# Patient Record
Sex: Male | Born: 1960
Health system: Southern US, Community
[De-identification: ages and names within clinical notes are randomized; demographics above are authoritative.]

## PROBLEM LIST (undated history)

## (undated) DIAGNOSIS — Z8719 Personal history of other diseases of the digestive system: Secondary | ICD-10-CM

## (undated) DIAGNOSIS — I10 Essential (primary) hypertension: Secondary | ICD-10-CM

## (undated) DIAGNOSIS — G4700 Insomnia, unspecified: Secondary | ICD-10-CM

## (undated) DIAGNOSIS — K219 Gastro-esophageal reflux disease without esophagitis: Secondary | ICD-10-CM

## (undated) DIAGNOSIS — M1990 Unspecified osteoarthritis, unspecified site: Secondary | ICD-10-CM

## (undated) HISTORY — PX: KNEE FUSION: SUR623

## (undated) HISTORY — DX: Unspecified osteoarthritis, unspecified site: M19.90

## (undated) HISTORY — DX: Essential (primary) hypertension: I10

## (undated) HISTORY — PX: CHOLECYSTECTOMY: SHX55

## (undated) HISTORY — PX: KNEE ARTHROSCOPY: SUR90

---

## 2003-03-19 ENCOUNTER — Emergency Department (HOSPITAL_COMMUNITY): Admission: EM | Admit: 2003-03-19 | Discharge: 2003-03-19 | Payer: Self-pay | Admitting: Emergency Medicine

## 2003-03-19 ENCOUNTER — Encounter: Payer: Self-pay | Admitting: Emergency Medicine

## 2009-09-12 ENCOUNTER — Emergency Department (HOSPITAL_COMMUNITY): Admission: EM | Admit: 2009-09-12 | Discharge: 2009-09-12 | Payer: Self-pay | Admitting: Emergency Medicine

## 2010-12-15 LAB — CBC
HCT: 47.9 % (ref 39.0–52.0)
Hemoglobin: 16.7 g/dL (ref 13.0–17.0)
MCHC: 34.8 g/dL (ref 30.0–36.0)
RBC: 5.01 MIL/uL (ref 4.22–5.81)
RDW: 13.5 % (ref 11.5–15.5)

## 2010-12-15 LAB — DIFFERENTIAL
Basophils Absolute: 0.1 10*3/uL (ref 0.0–0.1)
Lymphocytes Relative: 21 % (ref 12–46)
Lymphs Abs: 2.1 10*3/uL (ref 0.7–4.0)
Monocytes Absolute: 0.6 10*3/uL (ref 0.1–1.0)
Monocytes Relative: 6 % (ref 3–12)
Neutro Abs: 6.9 10*3/uL (ref 1.7–7.7)

## 2010-12-15 LAB — URINALYSIS, ROUTINE W REFLEX MICROSCOPIC
Nitrite: NEGATIVE
Protein, ur: NEGATIVE mg/dL
Urobilinogen, UA: 0.2 mg/dL (ref 0.0–1.0)
pH: 7 (ref 5.0–8.0)

## 2010-12-15 LAB — POCT CARDIAC MARKERS
CKMB, poc: 1.1 ng/mL (ref 1.0–8.0)
Troponin i, poc: <0.05 ng/mL (ref 0.00–0.09)

## 2010-12-15 LAB — COMPREHENSIVE METABOLIC PANEL
Albumin: 3.7 g/dL (ref 3.5–5.2)
Alkaline Phosphatase: 47 U/L (ref 39–117)
BUN: 11 mg/dL (ref 6–23)
Potassium: 3.9 mEq/L (ref 3.5–5.1)
Sodium: 139 mEq/L (ref 135–145)
Total Protein: 6 g/dL (ref 6.0–8.3)

## 2012-05-25 ENCOUNTER — Ambulatory Visit: Payer: Self-pay | Admitting: Family Medicine

## 2012-05-25 ENCOUNTER — Encounter: Payer: Self-pay | Admitting: Family Medicine

## 2012-05-25 VITALS — BP 166/120 | HR 85 | Temp 98.1°F | Resp 18 | Ht 72.0 in | Wt 189.0 lb

## 2012-05-25 DIAGNOSIS — K4090 Unilateral inguinal hernia, without obstruction or gangrene, not specified as recurrent: Secondary | ICD-10-CM

## 2012-05-25 DIAGNOSIS — R030 Elevated blood-pressure reading, without diagnosis of hypertension: Secondary | ICD-10-CM

## 2012-05-25 MED ORDER — HYDROCODONE-ACETAMINOPHEN 5-325 MG PO TABS: 1.0000 | ORAL_TABLET | Freq: Four times a day (QID) | ORAL | Status: AC | PRN

## 2012-05-25 NOTE — Progress Notes (Signed)
Subjective:    Patient ID: Todd Jones, male    DOB: 08-Jul-1961, 51 y.o.   MRN: [id number]  HPI Todd Jones is a 51 y.o. male  Thinks hernia popped up in groin? NKI.   Noticed about 3 weeks ago - tingling sensation, noticed bulge popping out last week - able to push back in.  Been more sore past week - more burning at end of day. Not pushing out as much as before - worse at times. No bowel changes, no blood in stool.  No change in urination.  No fever, no n/v.  Did take few of wife's percocet that helped.  Multiple family members with hernias - dad and brothers.  No personal prior hx of hernias.  Works part time at [employer]. Has shows next 3 days.  Prior bp meds- multiple different meds some with reactions - unknown which ones.    Review of Systems  Constitutional: Negative for fever and chills.  Eyes: Negative for visual disturbance.  Respiratory: Negative for chest tightness and shortness of breath.   Cardiovascular: Negative for chest pain.  Neurological: Negative for dizziness, speech difficulty, weakness and headaches.   As above.      Objective:   Physical Exam  Constitutional: He is oriented to person, place, and time. He appears well-developed and well-nourished.  HENT:  Head: Normocephalic and atraumatic.  Pulmonary/Chest: Effort normal.  Abdominal: Soft. A hernia is present. Hernia confirmed positive in the right inguinal area.    Neurological: He is alert and oriented to person, place, and time.  Skin: Skin is warm and dry. No rash noted. No erythema.  Psychiatric: He has a normal mood and affect. His behavior is normal.       Assessment & Plan:  Todd Jones is a 51 y.o. male 1. Right inguinal hernia  Ambulatory referral to General Surgery, HYDROcodone-acetaminophen (NORCO/VICODIN) 5-325 MG per tablet  2. Elevated BP       Elevated BP - possible pain component, hx of HTN with reactions to certain meds - unknown which ones -  advised to clarify most recent med that he did tolerate. Can follow up here previous practice  to restart med, but will likely need blood work.  Advised of risks of nontreatment of HTN, including CVA, MI, and kidney disease. He will check BP's outside of office and if remains elevated above 140/90 - needs to follow up here or previous provider.   R inguinal hernia.  No signs of strangulation at present, but discussed concerns with increasing pain and need or surgical eval.  Would like something for pain, and may not be able to see surgeon at this point, but agreed to referral to atleast discuss options. warning signs and when to go to emergency room discussed, with s/sx of hernia strangulation, including but not limited to increasing pain and inability to reduce. Advised against ANY straining or lifting, and to use stool softener such as colace to lessen change of straining with BM's. Can try supportive hernia strap at [facility] supply. Lortab Q h prn - #20, only if needed for pain - sed/precautions discussed, understanding of above expressed.    Patient Instructions  Avoid any straining or lifting until evaluated by surgeon.  You can take the hydrocodone up to every 6 hours if needed for pain, but do not drive while taking this medicine.  Drink plenty of water and take a stool softener such as colace to avoid straining with bowel  movements. We will refer you to a surgeon to discuss options, and you can pick up a strap to help with supporting this area at [facility].  If any increase in swelling, redness of skin, increase in pain, change in bowel function, vomiting, or inability to reduce hernia - go to the emergency room as these can be signs of strangulation of intestine in the hernia and would need to be evaluated right away.  Return to the clinic or go to the nearest emergency room if any of your symptoms worsen or new symptoms occur. Keep a record of your blood pressures outside of  the office and bring them to the next office visit- if remains above 140/90 - follow up here or your primary care provider.   Hernia A hernia occurs when an internal organ pushes out through a weak spot in the abdominal wall. Hernias most commonly occur in the groin and around the navel. Hernias often can be pushed back into place (reduced). Most hernias tend to get worse over time. Some abdominal hernias can get stuck in the opening (irreducible or incarcerated hernia) and cannot be reduced. An irreducible abdominal hernia which is tightly squeezed into the opening is at risk for impaired blood supply (strangulated hernia). A strangulated hernia is a medical emergency. Because of the risk for an irreducible or strangulated hernia, surgery may be recommended to repair a hernia. CAUSES   Heavy lifting.   Prolonged coughing.   Straining to have a bowel movement.   A cut (incision) made during an abdominal surgery.  HOME CARE INSTRUCTIONS   Bed rest is not required. You may continue your normal activities.   Avoid lifting more than 10 pounds (4.5 kg) or straining.   Cough gently. If you are a smoker it is best to stop. Even the best hernia repair can break down with the continual strain of coughing. Even if you do not have your hernia repaired, a cough will continue to aggravate the problem.   Do not wear anything tight over your hernia. Do not try to keep it in with an outside bandage or truss. These can damage abdominal contents if they are trapped within the hernia sac.   Eat a normal diet.   Avoid constipation. Straining over long periods of time will increase hernia size and encourage breakdown of repairs. If you cannot do this with diet alone, stool softeners may be used.  SEEK IMMEDIATE MEDICAL CARE IF:   You have a fever.   You develop increasing abdominal pain.   You feel nauseous or vomit.   Your hernia is stuck outside the abdomen, looks discolored, feels hard, or is tender.    You have any changes in your bowel habits or in the hernia that are unusual for you.   You have increased pain or swelling around the hernia.   You cannot push the hernia back in place by applying gentle pressure while lying down.  MAKE SURE YOU:   Understand these instructions.   Will watch your condition.   Will get help right away if you are not doing well or get worse.  Document Released: 08/31/2005 Document Revised: 08/20/2011 Document Reviewed: 04/19/2008 [facility] Patient Information 2012 [place], [place].

## 2012-05-25 NOTE — Patient Instructions (Addendum)
Avoid any straining or lifting until evaluated by surgeon.  You can take the hydrocodone up to every 6 hours if needed for pain, but do not drive while taking this medicine.  Drink plenty of water and take a stool softener such as colace to avoid straining with bowel movements. We will refer you to a surgeon to discuss options, and you can pick up a strap to help with supporting this area at [facility].  If any increase in swelling, redness of skin, increase in pain, change in bowel function, vomiting, or inability to reduce hernia - go to the emergency room as these can be signs of strangulation of intestine in the hernia and would need to be evaluated right away.  Return to the clinic or go to the nearest emergency room if any of your symptoms worsen or new symptoms occur. Keep a record of your blood pressures outside of the office and bring them to the next office visit- if remains above 140/90 - follow up here or your primary care provider.   Hernia A hernia occurs when an internal organ pushes out through a weak spot in the abdominal wall. Hernias most commonly occur in the groin and around the navel. Hernias often can be pushed back into place (reduced). Most hernias tend to get worse over time. Some abdominal hernias can get stuck in the opening (irreducible or incarcerated hernia) and cannot be reduced. An irreducible abdominal hernia which is tightly squeezed into the opening is at risk for impaired blood supply (strangulated hernia). A strangulated hernia is a medical emergency. Because of the risk for an irreducible or strangulated hernia, surgery may be recommended to repair a hernia. CAUSES   Heavy lifting.   Prolonged coughing.   Straining to have a bowel movement.   A cut (incision) made during an abdominal surgery.  HOME CARE INSTRUCTIONS   Bed rest is not required. You may continue your normal activities.   Avoid lifting more than 10 pounds (4.5 kg) or straining.    Cough gently. If you are a smoker it is best to stop. Even the best hernia repair can break down with the continual strain of coughing. Even if you do not have your hernia repaired, a cough will continue to aggravate the problem.   Do not wear anything tight over your hernia. Do not try to keep it in with an outside bandage or truss. These can damage abdominal contents if they are trapped within the hernia sac.   Eat a normal diet.   Avoid constipation. Straining over long periods of time will increase hernia size and encourage breakdown of repairs. If you cannot do this with diet alone, stool softeners may be used.  SEEK IMMEDIATE MEDICAL CARE IF:   You have a fever.   You develop increasing abdominal pain.   You feel nauseous or vomit.   Your hernia is stuck outside the abdomen, looks discolored, feels hard, or is tender.   You have any changes in your bowel habits or in the hernia that are unusual for you.   You have increased pain or swelling around the hernia.   You cannot push the hernia back in place by applying gentle pressure while lying down.  MAKE SURE YOU:   Understand these instructions.   Will watch your condition.   Will get help right away if you are not doing well or get worse.  Document Released: 08/31/2005 Document Revised: 08/20/2011 Document Reviewed: 04/19/2008 [employer] Patient Information 2012  [employer].

## 2012-06-13 ENCOUNTER — Encounter (INDEPENDENT_AMBULATORY_CARE_PROVIDER_SITE_OTHER): Payer: Self-pay | Admitting: General Surgery

## 2012-06-13 ENCOUNTER — Ambulatory Visit (INDEPENDENT_AMBULATORY_CARE_PROVIDER_SITE_OTHER): Payer: Self-pay | Admitting: General Surgery

## 2012-06-13 VITALS — BP 192/112 | HR 84 | Temp 97.3°F | Resp 16 | Ht 73.75 in | Wt 191.4 lb

## 2012-06-13 DIAGNOSIS — K4090 Unilateral inguinal hernia, without obstruction or gangrene, not specified as recurrent: Secondary | ICD-10-CM

## 2012-06-13 NOTE — Progress Notes (Signed)
Subjective:     Patient ID: Todd Jones, male   DOB: [birth year], 51 y.o.   MRN: [id number]  HPI The patient is 51 year old male with a several month history of right inguinal hernia. He states that the area has become more painful as an increase in size in the last several weeks. Patient is wearing a truss which does help with pain and burning sensation. The patient otherwise has no signs of incarceration or strangulation at this time.  The patient has no primary care is not undergoing treatment for his hypertension which patient he has a long-standing history.  Review of Systems  Constitutional: Negative.   HENT: Negative.   Eyes: Negative.   Cardiovascular: Negative.   Gastrointestinal: Negative.   Musculoskeletal: Negative.   Neurological: Negative.  Negative for seizures, weakness and headaches.       Objective:   Physical Exam  Constitutional: He is oriented to person, place, and time. He appears well-developed and well-nourished.  HENT:  Head: Normocephalic and atraumatic.  Eyes: Conjunctivae normal are normal. Pupils are equal, round, and reactive to light.  Neck: Normal range of motion. Neck supple.  Cardiovascular: Normal rate, regular rhythm and normal heart sounds.   Pulmonary/Chest: Effort normal and breath sounds normal.  Abdominal: Soft. Bowel sounds are normal. A hernia is present. Hernia confirmed positive in the right inguinal area (reducible).  Musculoskeletal: Normal range of motion.  Neurological: He is oriented to person, place, and time.       Assessment:     Patient is a 51 year old male with a right reducible inguinal hernia, a long-standing history of hypertension that is currently under treatment.    Plan:     1. I discussed the treatments of right inguinal hernia repair with the patient.  However in light of the patient's untreated hypertension recommend patient PCP at this time his blood pressure control prior to scheduling undergoing hernia  surgery. The patient understands the risk this point we discussed the signs and symptoms of inguinal hernia incarceration and report to the ED patient is having signs or symptoms.  The patient will followup after being seen and treated for his hypertension.

## 2012-07-06 ENCOUNTER — Other Ambulatory Visit (INDEPENDENT_AMBULATORY_CARE_PROVIDER_SITE_OTHER): Payer: Self-pay | Admitting: General Surgery

## 2012-07-06 ENCOUNTER — Telehealth (INDEPENDENT_AMBULATORY_CARE_PROVIDER_SITE_OTHER): Payer: Self-pay

## 2012-07-06 ENCOUNTER — Telehealth (INDEPENDENT_AMBULATORY_CARE_PROVIDER_SITE_OTHER): Payer: Self-pay | Admitting: General Surgery

## 2012-07-06 MED ORDER — OXYCODONE-ACETAMINOPHEN 5-325 MG PO TABS: 1.0000 | ORAL_TABLET | ORAL | Status: DC | PRN

## 2012-07-06 NOTE — Telephone Encounter (Signed)
Pt saw Dr. Derrell Lolling this past September for a hernia.  Due to lack of insurance he is unable to schedule his surgery until mid-December.  He has requested a refill of pain medication.

## 2012-07-06 NOTE — Telephone Encounter (Signed)
Called pt and we have a written Rx for percocet at the front desk

## 2012-07-06 NOTE — Telephone Encounter (Signed)
I've printed out another Rx for percocet that he can come and pick up.

## 2012-08-04 ENCOUNTER — Telehealth (INDEPENDENT_AMBULATORY_CARE_PROVIDER_SITE_OTHER): Payer: Self-pay | Admitting: General Surgery

## 2012-08-04 NOTE — Telephone Encounter (Signed)
Pt called to request additional pain meds.  He will have insurance by August 28, 2012, so will be ready to schedule his hernia surgery.  Last time he received meds was 07/06/12, for # 20.  Please advise via cell phone:  [phone]

## 2012-09-09 ENCOUNTER — Telehealth (INDEPENDENT_AMBULATORY_CARE_PROVIDER_SITE_OTHER): Payer: Self-pay

## 2012-09-09 NOTE — Telephone Encounter (Signed)
Pt has scheduled appt to see Dr Derrell Lolling in January to discuss having his hernia repair done. Pt request refill on his pain medication. Pt uses [facility]. Pt advised I will send request to Dr Derrell Lolling.

## 2012-09-09 NOTE — Telephone Encounter (Signed)
Dr. Derrell Lolling wrote Rx for Percocet 5/325 mg, # 30, 1 po Q6H prn pain, no refill.  Pt contacted to pick up Rx at the front desk.

## 2012-09-27 ENCOUNTER — Telehealth (INDEPENDENT_AMBULATORY_CARE_PROVIDER_SITE_OTHER): Payer: Self-pay | Admitting: General Surgery

## 2012-09-27 NOTE — Telephone Encounter (Signed)
Called patient to have him come in sooner for his appt on 1/15.Marland Kitchenpatient was very happy to do so

## 2012-09-28 ENCOUNTER — Other Ambulatory Visit (INDEPENDENT_AMBULATORY_CARE_PROVIDER_SITE_OTHER): Payer: Self-pay | Admitting: General Surgery

## 2012-09-28 ENCOUNTER — Ambulatory Visit (INDEPENDENT_AMBULATORY_CARE_PROVIDER_SITE_OTHER): Payer: BC Managed Care – PPO | Admitting: General Surgery

## 2012-09-28 ENCOUNTER — Encounter (INDEPENDENT_AMBULATORY_CARE_PROVIDER_SITE_OTHER): Payer: Self-pay | Admitting: General Surgery

## 2012-09-28 VITALS — BP 132/84 | HR 64 | Temp 97.5°F | Resp 18 | Ht 73.0 in | Wt 200.6 lb

## 2012-09-28 DIAGNOSIS — K4090 Unilateral inguinal hernia, without obstruction or gangrene, not specified as recurrent: Secondary | ICD-10-CM

## 2012-09-28 NOTE — Progress Notes (Signed)
Patient ID: Todd Jones, male   DOB: 01/22/1961, 51 y.o.   MRN: [id number]  Chief Complaint  Patient presents with  . Pre-op Exam    eval RIH    HPI Todd Jones is a 51 y.o. male.  The patient is a 51-year-old male who was seen previously for a right inguinal hernia. Patient returned with increased right inguinal hernia, increased pain, and better blood pressure control. Patient has been wearing a truss which she states helps at times pain of the hernia. Patient is continued exercise and his blood pressure is 132/84. HPI  Past Medical History  Diagnosis Date  . Arthritis   . Hypertension     Past Surgical History  Procedure Date  . Cholecystectomy   . Knee fusion     Family History  Problem Relation Age of Onset  . Cancer Maternal Grandfather     lung  . Alcohol abuse Maternal Grandfather     Social History History  Substance Use Topics  . Smoking status: Former Smoker  . Smokeless tobacco: Never Used  . Alcohol Use: No    No Known Allergies  Current Outpatient Prescriptions  Medication Sig Dispense Refill  . oxyCODONE-acetaminophen (ROXICET) 5-325 MG per tablet Take 1 tablet by mouth every 4 (four) hours as needed for pain.  20 tablet  0    Review of Systems Review of Systems  Constitutional: Negative.   HENT: Negative.   Eyes: Negative.   Respiratory: Negative.   Cardiovascular: Negative.   Gastrointestinal: Negative.   Neurological: Negative.     Blood pressure 132/84, pulse 64, temperature 97.5 F (36.4 C), temperature source Temporal, resp. rate 18, height 6' 1" (1.854 m), weight 200 lb 9.6 oz (90.992 kg).  Physical Exam Physical Exam  Constitutional: He is oriented to person, place, and time. He appears well-developed and well-nourished.  HENT:  Head: Normocephalic and atraumatic.  Eyes: Conjunctivae normal are normal. Pupils are equal, round, and reactive to light.  Neck: Normal range of motion. Neck supple.  Cardiovascular: Normal rate,  regular rhythm and normal heart sounds.   Pulmonary/Chest: Effort normal and breath sounds normal.  Abdominal: Soft. Bowel sounds are normal. A hernia is present. Hernia confirmed positive in the right inguinal area. Hernia confirmed negative in the left inguinal area.  Musculoskeletal: Normal range of motion.  Neurological: He is alert and oriented to person, place, and time.    Data Reviewed none  Assessment    51-year-old male with a right inguinal hernia likely direct and indirect    Plan    1. We'll proceed operating room for laparoscopic right anal hernia repair with mesh.  2. All risks and benefits were discussed with the patient, to generally include infection, bleeding, damage to surrounding structures, and recurrence. Alternatives were offered and described.  All questions were answered and the patient voiced understanding of the procedure and wishes to proceed at this point.         Tacara Hadlock Jr., [facility] Riches 09/28/2012, 2:11 PM    

## 2012-10-03 NOTE — Progress Notes (Signed)
Need orders in [facility] for surgery on 10/14/12.  Preop will be on 10/07/12 at 1100am.  Thanks.

## 2012-10-04 ENCOUNTER — Other Ambulatory Visit (INDEPENDENT_AMBULATORY_CARE_PROVIDER_SITE_OTHER): Payer: Self-pay | Admitting: General Surgery

## 2012-10-04 ENCOUNTER — Encounter (HOSPITAL_COMMUNITY): Payer: Self-pay | Admitting: Pharmacy Technician

## 2012-10-06 ENCOUNTER — Other Ambulatory Visit (HOSPITAL_COMMUNITY): Payer: Self-pay | Admitting: General Surgery

## 2012-10-06 NOTE — Patient Instructions (Addendum)
20 Todd Jones  10/06/2012   Your procedure is scheduled on:  10/14/12 FRIDAY  Report to [facility] at  0530     AM.  Call this number if you have problems the morning of surgery: [phone]       Remember:   Do not eat food  Or drink :After Midnight. Thursday NIGHT   Take these medicines the morning of surgery with A SIP OF WATER:  precocet if needed   .  Contacts, dentures or partial plates can not be worn to surgery  Leave suitcase in the car. After surgery it may be brought to your room.  For patients admitted to the hospital, checkout time is 11:00 AM day of  discharge.             SPECIAL INSTRUCTIONS- SEE [facility] PREPARING FOR SURGERY INSTRUCTION SHEET-     DO NOT WEAR JEWELRY, LOTIONS, POWDERS, OR PERFUMES.  WOMEN-- DO NOT SHAVE LEGS OR UNDERARMS FOR 12 HOURS BEFORE SHOWERS. MEN MAY SHAVE FACE.  Patients discharged the day of surgery will not be allowed to drive home. IF going home the day of surgery, you must have a driver and someone to stay with you for the first 24 hours  Name and phone number of your driver:  Wife  [name]  [phone]                                                                      Please read over the following fact sheets that you were given: MRSA Information, Incentive Spirometry Sheet, Blood Transfusion Sheet  Information                                                                                   [name]  PST 336  [phone]                 FAILURE TO FOLLOW THESE INSTRUCTIONS MAY RESULT IN  CANCELLATION   OF YOUR SURGERY                                                  Patient Signature _____________________________

## 2012-10-07 ENCOUNTER — Encounter (HOSPITAL_COMMUNITY)
Admission: RE | Admit: 2012-10-07 | Discharge: 2012-10-07 | Disposition: A | Discharge status: Home / Self Care | Payer: BC Managed Care – PPO | Source: Ambulatory Visit | Attending: General Surgery | Admitting: General Surgery

## 2012-10-07 ENCOUNTER — Ambulatory Visit (HOSPITAL_COMMUNITY)
Admission: RE | Admit: 2012-10-07 | Discharge: 2012-10-07 | Disposition: A | Discharge status: Home / Self Care | Payer: BC Managed Care – PPO | Source: Ambulatory Visit | Attending: General Surgery | Admitting: General Surgery

## 2012-10-07 ENCOUNTER — Encounter (HOSPITAL_COMMUNITY): Payer: Self-pay

## 2012-10-07 ENCOUNTER — Telehealth (INDEPENDENT_AMBULATORY_CARE_PROVIDER_SITE_OTHER): Payer: Self-pay | Admitting: General Surgery

## 2012-10-07 DIAGNOSIS — K4090 Unilateral inguinal hernia, without obstruction or gangrene, not specified as recurrent: Secondary | ICD-10-CM | POA: Insufficient documentation

## 2012-10-07 DIAGNOSIS — Z01812 Encounter for preprocedural laboratory examination: Secondary | ICD-10-CM | POA: Insufficient documentation

## 2012-10-07 HISTORY — DX: Insomnia, unspecified: G47.00

## 2012-10-07 HISTORY — DX: Personal history of other diseases of the digestive system: Z87.19

## 2012-10-07 HISTORY — DX: Gastro-esophageal reflux disease without esophagitis: K21.9

## 2012-10-07 LAB — BASIC METABOLIC PANEL
BUN: 11 mg/dL (ref 6–23)
GFR calc non Af Amer: >90 mL/min (ref >90)
Glucose, Bld: 98 mg/dL (ref 70–99)
Potassium: 4.7 mEq/L (ref 3.5–5.1)

## 2012-10-07 LAB — CBC
HCT: 46.9 % (ref 39.0–52.0)
Hemoglobin: 15.7 g/dL (ref 13.0–17.0)
MCH: 32 pg (ref 26.0–34.0)
MCHC: 33.5 g/dL (ref 30.0–36.0)
MCV: 95.5 fL (ref 78.0–100.0)

## 2012-10-07 LAB — SURGICAL PCR SCREEN: MRSA, PCR: NEGATIVE

## 2012-10-07 NOTE — Progress Notes (Signed)
10/07/12 1123  OBSTRUCTIVE SLEEP APNEA  Have you ever been diagnosed with sleep apnea through a sleep study? No  Do you snore loudly (loud enough to be heard through closed doors)?  0  Do you often feel tired, fatigued, or sleepy during the daytime? 1  Has anyone observed you stop breathing during your sleep? 0  Do you have, or are you being treated for high blood pressure? 1  BMI more than 35 kg/m2? 0  Age over 54 years old? 1  Neck circumference greater than 40 cm/18 inches? 0  Gender: 1  Obstructive Sleep Apnea Score 4   Score 4 or greater  Results sent to PCP (surgeon)

## 2012-10-07 NOTE — Telephone Encounter (Signed)
Called patient to let him know that protocol Norco 5/325 #30 no refills was called into [facility]..patient is ok with this

## 2012-10-07 NOTE — Progress Notes (Signed)
Dr Derrell Lolling-   I saw Todd Jones in PST today-  Has had HTN in the past- on no meds and doesnt have  PCP as hasnt had any insurance.  BP 153/105, repeated x 2 with 189/102 as the lowest diastolic number. I instructed him he needs to have this evaluated before surgery and informed him of risk factors with BP this elevated and that if remains this high day of surgery anesthesia may cancel him. Stated would follow up.  Also STOP BANG SCORE 4- FYI.  Thanks

## 2012-10-10 ENCOUNTER — Ambulatory Visit (INDEPENDENT_AMBULATORY_CARE_PROVIDER_SITE_OTHER): Payer: BC Managed Care – PPO | Admitting: Family Medicine

## 2012-10-10 VITALS — BP 157/109 | HR 67 | Temp 98.3°F | Resp 16 | Ht 73.0 in | Wt 202.0 lb

## 2012-10-10 DIAGNOSIS — I10 Essential (primary) hypertension: Secondary | ICD-10-CM

## 2012-10-10 MED ORDER — LISINOPRIL 10 MG PO TABS: 10.0000 mg | ORAL_TABLET | Freq: Every day | ORAL | Status: DC

## 2012-10-10 NOTE — Patient Instructions (Addendum)
Start on one lisinopril pill a day.  If your BP is still running higher than 140/ 90 after 3 or 4 days we can increase to 2 pills (20 mg total) a day. Let's plan to recheck after your surgery to see how your BP is doing.  Call if any questions about your BP in the meantime

## 2012-10-10 NOTE — Progress Notes (Signed)
[facility] [address] [place] [address] [phone]  Date:  10/10/2012   Name:  Todd Jones   DOB:  01/27/1961   MRN:  [id number]  PCP:  Provider Not In System    Chief Complaint: Hypertension   History of Present Illness:  Todd Jones is a 52 y.o. very pleasant male patient who presents with the following:  He is here today to discuss his BP.  He has had HTN since his 27's- however he lost weight and had been able to do without medications for some years.   He checks his BP at home, and it has been running about 130- 180/ 98- 102.  He has noted this for about 6 months.  There has been a lot of stress at home as his wife has lung cancer that is not curable.   He is having inguinal hernia repair next week- he needs his BP to be under control for his surgery.  He had a bad reaction to metoprolol in the past, but is not sure what else he took in the past.   He has not been on medication in about 12 years.   Recent normal CMP for pre- op planning    There is no problem list on file for this patient.   Past Medical History  Diagnosis Date  . Arthritis   . Hypertension   . GERD (gastroesophageal reflux disease)   . H/O hiatal hernia   . Insomnia     Past Surgical History  Procedure Date  . Cholecystectomy   . Knee fusion   . Knee arthroscopy     right x 3    History  Substance Use Topics  . Smoking status: Former Games developer  . Smokeless tobacco: Never Used  . Alcohol Use: No    Family History  Problem Relation Age of Onset  . Cancer Maternal Grandfather     lung  . Alcohol abuse Maternal Grandfather     No Known Allergies  Medication list has been reviewed and updated.  Current Outpatient Prescriptions on File Prior to Visit  Medication Sig Dispense Refill  . oxyCODONE-acetaminophen (PERCOCET) 10-325 MG per tablet Take 1 tablet by mouth 3 (three) times daily as needed. Pain        Review of Systems:  As per HPI-  otherwise negative.   Physical Examination: Filed Vitals:   10/10/12 1543  BP: 157/109  Pulse: 67  Temp: 98.3 F (36.8 C)  Resp: 16   Filed Vitals:   10/10/12 1543  Height: 6\' 1"  (1.854 m)  Weight: 202 lb (91.627 kg)   Body mass index is 26.65 kg/(m^2). Ideal Body Weight: Weight in (lb) to have BMI = 25: 189.1   GEN: WDWN, NAD, Non-toxic, A & O x 3 HEENT: Atraumatic, Normocephalic. Neck supple. No masses, No LAD. Ears and Nose: No external deformity. CV: RRR, No M/G/R. No JVD. No thrill. No extra heart sounds. PULM: CTA B, no wheezes, crackles, rhonchi. No retractions. No resp. distress. No accessory muscle use. ABD: S, NT, ND. No rebound. No HSM. EXTR: No c/c/e NEURO Normal gait.  PSYCH: Normally interactive. Conversant. Not depressed or anxious appearing.  Calm demeanor.    Assessment and Plan: 1. HTN (hypertension)  lisinopril (PRINIVIL,ZESTRIL) 10 MG tablet   Start lisinopril at 10 mg, will likely need to go up on his dose.  He will increase if BP still above goal in a few days.  Follow- up  after surgery for recheck of BP and also to discuss his chronic insomnia  Mickel Schreur, MD

## 2012-10-14 ENCOUNTER — Encounter (HOSPITAL_COMMUNITY): Payer: Self-pay | Admitting: Anesthesiology

## 2012-10-14 ENCOUNTER — Ambulatory Visit (HOSPITAL_COMMUNITY)
Admission: RE | Admit: 2012-10-14 | Discharge: 2012-10-14 | Disposition: A | Discharge status: Home / Self Care | Payer: BC Managed Care – PPO | Source: Ambulatory Visit | Attending: General Surgery | Admitting: General Surgery

## 2012-10-14 ENCOUNTER — Ambulatory Visit (HOSPITAL_COMMUNITY): Payer: BC Managed Care – PPO | Admitting: Anesthesiology

## 2012-10-14 ENCOUNTER — Encounter (HOSPITAL_COMMUNITY): Payer: Self-pay | Admitting: *Deleted

## 2012-10-14 ENCOUNTER — Encounter (HOSPITAL_COMMUNITY)
Admission: RE | Disposition: A | Discharge status: Home / Self Care | Payer: Self-pay | Source: Ambulatory Visit | Attending: General Surgery

## 2012-10-14 DIAGNOSIS — K4090 Unilateral inguinal hernia, without obstruction or gangrene, not specified as recurrent: Secondary | ICD-10-CM

## 2012-10-14 DIAGNOSIS — I10 Essential (primary) hypertension: Secondary | ICD-10-CM | POA: Insufficient documentation

## 2012-10-14 HISTORY — PX: INSERTION OF MESH: SHX5868

## 2012-10-14 HISTORY — PX: INGUINAL HERNIA REPAIR: SHX194

## 2012-10-14 SURGERY — REPAIR, HERNIA, INGUINAL, LAPAROSCOPIC
Anesthesia: General | Site: Groin | Laterality: Right | Wound class: Clean

## 2012-10-14 MED ORDER — NEOSTIGMINE METHYLSULFATE 1 MG/ML IJ SOLN
INTRAMUSCULAR | Status: DC | PRN
  Administered 2012-10-14: 4 mg via INTRAVENOUS

## 2012-10-14 MED ORDER — FENTANYL CITRATE 0.05 MG/ML IJ SOLN
25.0000 ug | INTRAMUSCULAR | Status: DC | PRN
  Administered 2012-10-14 (×3): 50 ug via INTRAVENOUS

## 2012-10-14 MED ORDER — KETOROLAC TROMETHAMINE 30 MG/ML IJ SOLN
INTRAMUSCULAR | Status: DC | PRN
  Administered 2012-10-14: 30 mg via INTRAVENOUS

## 2012-10-14 MED ORDER — LIDOCAINE HCL (CARDIAC) 20 MG/ML IV SOLN
INTRAVENOUS | Status: DC | PRN
  Administered 2012-10-14: 100 mg via INTRAVENOUS

## 2012-10-14 MED ORDER — ACETAMINOPHEN 650 MG RE SUPP
650.0000 mg | RECTAL | Status: DC | PRN
  Filled 2012-10-14: qty 1

## 2012-10-14 MED ORDER — ONDANSETRON HCL 4 MG/2ML IJ SOLN: 4.0000 mg | Freq: Four times a day (QID) | INTRAMUSCULAR | Status: DC | PRN

## 2012-10-14 MED ORDER — PROMETHAZINE HCL 25 MG/ML IJ SOLN: 6.2500 mg | INTRAMUSCULAR | Status: DC | PRN

## 2012-10-14 MED ORDER — BUPIVACAINE-EPINEPHRINE 0.25% -1:200000 IJ SOLN
INTRAMUSCULAR | Status: DC | PRN
  Administered 2012-10-14: 20 mL
  Administered 2012-10-14: 15 mL

## 2012-10-14 MED ORDER — BUPIVACAINE-EPINEPHRINE PF 0.25-1:200000 % IJ SOLN
INTRAMUSCULAR | Status: AC
  Filled 2012-10-14: qty 30

## 2012-10-14 MED ORDER — FENTANYL CITRATE 0.05 MG/ML IJ SOLN
INTRAMUSCULAR | Status: AC
  Filled 2012-10-14: qty 2

## 2012-10-14 MED ORDER — ACETAMINOPHEN 10 MG/ML IV SOLN
INTRAVENOUS | Status: DC | PRN
  Administered 2012-10-14: 1000 mg via INTRAVENOUS

## 2012-10-14 MED ORDER — OXYCODONE HCL 5 MG PO TABS: 5.0000 mg | ORAL_TABLET | ORAL | Status: DC | PRN

## 2012-10-14 MED ORDER — DEXAMETHASONE SODIUM PHOSPHATE 10 MG/ML IJ SOLN
INTRAMUSCULAR | Status: DC | PRN
  Administered 2012-10-14: 8 mg via INTRAVENOUS

## 2012-10-14 MED ORDER — SODIUM CHLORIDE 0.9 % IV SOLN: 250.0000 mL | INTRAVENOUS | Status: DC | PRN

## 2012-10-14 MED ORDER — FENTANYL CITRATE 0.05 MG/ML IJ SOLN
INTRAMUSCULAR | Status: DC | PRN
  Administered 2012-10-14 (×2): 50 ug via INTRAVENOUS
  Administered 2012-10-14: 100 ug via INTRAVENOUS
  Administered 2012-10-14 (×2): 50 ug via INTRAVENOUS

## 2012-10-14 MED ORDER — SODIUM CHLORIDE 0.9 % IJ SOLN: 3.0000 mL | INTRAMUSCULAR | Status: DC | PRN

## 2012-10-14 MED ORDER — LACTATED RINGERS IV SOLN
INTRAVENOUS | Status: DC | PRN
  Administered 2012-10-14 (×2): via INTRAVENOUS

## 2012-10-14 MED ORDER — CEFAZOLIN SODIUM-DEXTROSE 2-3 GM-% IV SOLR
INTRAVENOUS | Status: AC
  Filled 2012-10-14: qty 50

## 2012-10-14 MED ORDER — SODIUM CHLORIDE 0.9 % IJ SOLN: 3.0000 mL | Freq: Two times a day (BID) | INTRAMUSCULAR | Status: DC

## 2012-10-14 MED ORDER — GLYCOPYRROLATE 0.2 MG/ML IJ SOLN
INTRAMUSCULAR | Status: DC | PRN
  Administered 2012-10-14: 0.6 mg via INTRAVENOUS

## 2012-10-14 MED ORDER — PROPOFOL 10 MG/ML IV BOLUS
INTRAVENOUS | Status: DC | PRN
  Administered 2012-10-14: 200 mg via INTRAVENOUS

## 2012-10-14 MED ORDER — SUCCINYLCHOLINE CHLORIDE 20 MG/ML IJ SOLN
INTRAMUSCULAR | Status: DC | PRN
  Administered 2012-10-14: 100 mg via INTRAVENOUS

## 2012-10-14 MED ORDER — CEFAZOLIN SODIUM-DEXTROSE 2-3 GM-% IV SOLR
2.0000 g | INTRAVENOUS | Status: AC
  Administered 2012-10-14: 2 g via INTRAVENOUS

## 2012-10-14 MED ORDER — SODIUM CHLORIDE 0.9 % IR SOLN
Status: DC | PRN
  Administered 2012-10-14: 1000 mL

## 2012-10-14 MED ORDER — KETOROLAC TROMETHAMINE 30 MG/ML IJ SOLN: 15.0000 mg | Freq: Once | INTRAMUSCULAR | Status: DC | PRN

## 2012-10-14 MED ORDER — LACTATED RINGERS IV SOLN: INTRAVENOUS | Status: DC

## 2012-10-14 MED ORDER — ONDANSETRON HCL 4 MG/2ML IJ SOLN
INTRAMUSCULAR | Status: DC | PRN
  Administered 2012-10-14: 4 mg via INTRAVENOUS

## 2012-10-14 MED ORDER — ACETAMINOPHEN 10 MG/ML IV SOLN
INTRAVENOUS | Status: AC
  Filled 2012-10-14: qty 100

## 2012-10-14 MED ORDER — OXYCODONE-ACETAMINOPHEN 5-325 MG PO TABS: 1.0000 | ORAL_TABLET | ORAL | Status: DC | PRN

## 2012-10-14 MED ORDER — CISATRACURIUM BESYLATE (PF) 10 MG/5ML IV SOLN
INTRAVENOUS | Status: DC | PRN
  Administered 2012-10-14: 5 mg via INTRAVENOUS

## 2012-10-14 MED ORDER — ACETAMINOPHEN 325 MG PO TABS: 650.0000 mg | ORAL_TABLET | ORAL | Status: DC | PRN

## 2012-10-14 MED ORDER — MIDAZOLAM HCL 5 MG/5ML IJ SOLN
INTRAMUSCULAR | Status: DC | PRN
  Administered 2012-10-14: 2 mg via INTRAVENOUS

## 2012-10-14 MED ORDER — HYDRALAZINE HCL 20 MG/ML IJ SOLN
INTRAMUSCULAR | Status: DC | PRN
  Administered 2012-10-14: 2.5 mg via INTRAVENOUS

## 2012-10-14 SURGICAL SUPPLY — 39 items
APPLIER CLIP 5 13 M/L LIGAMAX5 (MISCELLANEOUS)
BENZOIN TINCTURE PRP APPL 2/3 (GAUZE/BANDAGES/DRESSINGS) ×2 IMPLANT
CLIP APPLIE 5 13 M/L LIGAMAX5 (MISCELLANEOUS) IMPLANT
CLOTH BEACON ORANGE TIMEOUT ST (SAFETY) ×2 IMPLANT
DECANTER SPIKE VIAL GLASS SM (MISCELLANEOUS) ×2 IMPLANT
DERMABOND ADVANCED (GAUZE/BANDAGES/DRESSINGS)
DERMABOND ADVANCED .7 DNX12 (GAUZE/BANDAGES/DRESSINGS) IMPLANT
DEVICE SECURE STRAP 25 ABSORB (INSTRUMENTS) IMPLANT
DISSECT BALLN SPACEMKR OVL PDB (BALLOONS) ×2
DISSECTOR BALLN SPCMKR OVL PDB (BALLOONS) ×1 IMPLANT
DISSECTOR BLUNT TIP ENDO 5MM (MISCELLANEOUS) IMPLANT
DRAPE LAPAROSCOPIC ABDOMINAL (DRAPES) ×2 IMPLANT
DRAPE UTILITY XL STRL (DRAPES) ×2 IMPLANT
ELECT REM PT RETURN 9FT ADLT (ELECTROSURGICAL) ×2
ELECTRODE REM PT RTRN 9FT ADLT (ELECTROSURGICAL) ×1 IMPLANT
GLOVE BIO SURGEON STRL SZ7.5 (GLOVE) ×4 IMPLANT
GLOVE BIOGEL PI IND STRL 7.0 (GLOVE) ×1 IMPLANT
GLOVE BIOGEL PI INDICATOR 7.0 (GLOVE) ×1
GOWN PREVENTION PLUS XLARGE (GOWN DISPOSABLE) ×2 IMPLANT
GOWN STRL NON-REIN LRG LVL3 (GOWN DISPOSABLE) ×2 IMPLANT
GOWN STRL REIN XL XLG (GOWN DISPOSABLE) ×6 IMPLANT
KIT BASIN OR (CUSTOM PROCEDURE TRAY) ×2 IMPLANT
MARKER SKIN DUAL TIP RULER LAB (MISCELLANEOUS) ×2 IMPLANT
MESH PARIETEX 20X20CM (Mesh General) ×2 IMPLANT
NEEDLE INSUFFLATION 14GA 120MM (NEEDLE) ×2 IMPLANT
SET IRRIG TUBING LAPAROSCOPIC (IRRIGATION / IRRIGATOR) IMPLANT
SOLUTION ANTI FOG 6CC (MISCELLANEOUS) ×2 IMPLANT
STAPLER HERNIA 12 8.5 360D (INSTRUMENTS) ×2 IMPLANT
STRIP CLOSURE SKIN 1/2X4 (GAUZE/BANDAGES/DRESSINGS) ×2 IMPLANT
SUT MNCRL AB 4-0 PS2 18 (SUTURE) ×4 IMPLANT
SYRINGE IRR TOOMEY STRL 70CC (SYRINGE) IMPLANT
TACKER 5MM HERNIA 3.5CML NAB (ENDOMECHANICALS) IMPLANT
TOWEL OR 17X26 10 PK STRL BLUE (TOWEL DISPOSABLE) ×2 IMPLANT
TRAY FOLEY CATH 14FRSI W/METER (CATHETERS) ×2 IMPLANT
TRAY LAP CHOLE (CUSTOM PROCEDURE TRAY) ×2 IMPLANT
TROCAR BLADELESS OPT 5 75 (ENDOMECHANICALS) IMPLANT
TROCAR CANNULA W/PORT DUAL 5MM (MISCELLANEOUS) ×4 IMPLANT
TROCAR XCEL 12X100 BLDLESS (ENDOMECHANICALS) IMPLANT
TUBING INSUFFLATION 10FT LAP (TUBING) ×2 IMPLANT

## 2012-10-14 NOTE — Anesthesia Postprocedure Evaluation (Signed)
  Anesthesia Post-op Note  Patient: Todd Jones  Procedure(s) Performed: Procedure(s) (LRB): LAPAROSCOPIC INGUINAL HERNIA (Right) INSERTION OF MESH (Right)  Patient Location: PACU  Anesthesia Type: General  Level of Consciousness: awake and alert   Airway and Oxygen Therapy: Patient Spontanous Breathing  Post-op Pain: mild  Post-op Assessment: Post-op Vital signs reviewed, Patient's Cardiovascular Status Stable, Respiratory Function Stable, Patent Airway and No signs of Nausea or vomiting  Last Vitals:  Filed Vitals:   10/14/12 0900  BP: 177/97  Pulse: 67  Temp:   Resp: 12    Post-op Vital Signs: stable   Complications: No apparent anesthesia complications

## 2012-10-14 NOTE — Transfer of Care (Signed)
Immediate Anesthesia Transfer of Care Note  Patient: Todd Jones  Procedure(s) Performed: Procedure(s) (LRB) with comments: LAPAROSCOPIC INGUINAL HERNIA (Right) INSERTION OF MESH (Right)  Patient Location: PACU  Anesthesia Type:General  Level of Consciousness: awake, alert  and oriented  Airway & Oxygen Therapy: Patient Spontanous Breathing and Patient connected to face mask oxygen  Post-op Assessment: Report given to PACU RN and Post -op Vital signs reviewed and stable  Post vital signs: Reviewed and stable  Complications: No apparent anesthesia complications

## 2012-10-14 NOTE — Anesthesia Procedure Notes (Signed)
Procedure Name: Intubation Date/Time: 10/14/2012 7:14 AM Performed by: Leroy Libman L Patient Re-evaluated:Patient Re-evaluated prior to inductionOxygen Delivery Method: Circle system utilized Preoxygenation: Pre-oxygenation with 100% oxygen Intubation Type: IV induction Ventilation: Mask ventilation without difficulty and Oral airway inserted - appropriate to patient size Laryngoscope Size: Miller and 3 Grade View: Grade I Tube type: Oral Tube size: 8.0 mm Number of attempts: 1 Airway Equipment and Method: Stylet Placement Confirmation: ETT inserted through vocal cords under direct vision,  breath sounds checked- equal and bilateral and positive ETCO2 Secured at: 23 cm Tube secured with: Tape Dental Injury: Teeth and Oropharynx as per pre-operative assessment

## 2012-10-14 NOTE — Op Note (Signed)
Pre Operative Diagnosis:  RIH   Post Operative Diagnosis: R indirect inguinal hernia  Procedure: Lap RIHR with mesh  Surgeon: Dr. Axel Filler  Assistant: none  Anesthesia: GETA  EBL: 5cc  Complications: none  Counts: reported as correct x 2  Findings:  Large R indirect inguinal hernia  Indications for procedure:  The pt is a 52 y/o M with several month h/o RIH pain.  Pt decided secondary to pain to get this repair electively.  Details of the procedure:The patient was taken back to the operating room. The patient was placed in supine position with bilateral SCDs in place. After appropriate anitbiotics were confirmed, a time-out was confirmed and all facts were verified.  0.25% Marcaine was used to infiltrate the umbilical area. He was used to cut down the skin and blunt dissection was used to get the anterior fashion.  The anterior fascia was incised approximately 1 cm and the muscles were divided anteriorly. Blunt dissection was then used to create a space in the preperitoneal area. At this time a 10 mm camera was then introduced into the space and advanced the pubic tubercle and a 12 mm trocar was placed over this and insufflation was started.  At this time and space was created from medial to laterally the preperitoneal space. The hernia sac was identified and was seen in the indirect space. Dissection of the hernia sac was undertaken the vas deferens was identified and protected in all parts of the case.    Once the large hernia sac was taken down to approximately the umbilicus to TET 20x20 Parietex mesh was cut into shape and introduced into the preperitoneal space.  THe hernia sac retracted toward the abd above the level of the umbilicus.  The mesh was brought over the hernia defect and anchored into place and secured to Cooper's ligament with 4.0 there was a hernia stapler staples. It was anchored to the anterior abdominal wall with 4.8 mm staples.  There was no staples placed  laterally. The insufflation was evacuated. The trochars were removed. The anterior fascia was reapproximated using #1 Vicryl on a UR- 6.  Intra-abdominal air was evacuated the Veress needle. The skin was reapproximated using 4-0 Monocryl subcuticular fashion the patient was awakened from general anesthesia and taken to recovery in stable condition.   The patient was taken to the recovery room in stable condition.

## 2012-10-14 NOTE — Anesthesia Preprocedure Evaluation (Signed)
Anesthesia Evaluation  Patient identified by MRN, date of birth, ID band Patient awake    Reviewed: Allergy & Precautions, H&P , NPO status , Patient's Chart, lab work & pertinent test results  Airway Mallampati: III TM Distance: <3 FB Neck ROM: Full    Dental No notable dental hx.    Pulmonary neg pulmonary ROS,  breath sounds clear to auscultation  Pulmonary exam normal       Cardiovascular hypertension, Rhythm:Regular Rate:Normal     Neuro/Psych negative neurological ROS  negative psych ROS   GI/Hepatic negative GI ROS, Neg liver ROS, GERD-  ,  Endo/Other  negative endocrine ROS  Renal/GU negative Renal ROS  negative genitourinary   Musculoskeletal negative musculoskeletal ROS (+)   Abdominal   Peds negative pediatric ROS (+)  Hematology negative hematology ROS (+)   Anesthesia Other Findings   Reproductive/Obstetrics negative OB ROS                           Anesthesia Physical Anesthesia Plan  ASA: II  Anesthesia Plan: General   Post-op Pain Management:    Induction: Intravenous  Airway Management Planned: Oral ETT  Additional Equipment:   Intra-op Plan:   Post-operative Plan: Extubation in OR  Informed Consent: I have reviewed the patients History and Physical, chart, labs and discussed the procedure including the risks, benefits and alternatives for the proposed anesthesia with the patient or authorized representative who has indicated his/her understanding and acceptance.   Dental advisory given  Plan Discussed with: CRNA and Surgeon  Anesthesia Plan Comments:         Anesthesia Quick Evaluation

## 2012-10-14 NOTE — Interval H&P Note (Signed)
History and Physical Interval Note:  10/14/2012 7:02 AM  Todd Jones  has presented today for surgery, with the diagnosis of right inguinal hernia   The various methods of treatment have been discussed with the patient and family. After consideration of risks, benefits and other options for treatment, the patient has consented to  Procedure(s) (LRB) with comments: LAPAROSCOPIC INGUINAL HERNIA (Right) INSERTION OF MESH (Right) as a surgical intervention .  The patient's history has been reviewed, patient examined, no change in status, stable for surgery.  I have reviewed the patient's chart and labs.  Questions were answered to the patient's satisfaction.     Marigene Ehlers., Jed [place]imerick

## 2012-10-14 NOTE — Preoperative (Signed)
Beta Blockers   Reason not to administer Beta Blockers:Not Applicable 

## 2012-10-14 NOTE — H&P (View-Only) (Signed)
Patient ID: Todd Jones, male   DOB: 15-Oct-1960, 52 y.o.   MRN: [id number]  Chief Complaint  Patient presents with  . Pre-op Exam    eval RIH    HPI Todd Jones is a 52 y.o. male.  The patient is a 52 year old male who was seen previously for a right inguinal hernia. Patient returned with increased right inguinal hernia, increased pain, and better blood pressure control. Patient has been wearing a truss which she states helps at times pain of the hernia. Patient is continued exercise and his blood pressure is 132/84. HPI  Past Medical History  Diagnosis Date  . Arthritis   . Hypertension     Past Surgical History  Procedure Date  . Cholecystectomy   . Knee fusion     Family History  Problem Relation Age of Onset  . Cancer Maternal Grandfather     lung  . Alcohol abuse Maternal Grandfather     Social History History  Substance Use Topics  . Smoking status: Former Games developer  . Smokeless tobacco: Never Used  . Alcohol Use: No    No Known Allergies  Current Outpatient Prescriptions  Medication Sig Dispense Refill  . oxyCODONE-acetaminophen (ROXICET) 5-325 MG per tablet Take 1 tablet by mouth every 4 (four) hours as needed for pain.  20 tablet  0    Review of Systems Review of Systems  Constitutional: Negative.   HENT: Negative.   Eyes: Negative.   Respiratory: Negative.   Cardiovascular: Negative.   Gastrointestinal: Negative.   Neurological: Negative.     Blood pressure 132/84, pulse 64, temperature 97.5 F (36.4 C), temperature source Temporal, resp. rate 18, height 6\' 1"  (1.854 m), weight 200 lb 9.6 oz (90.992 kg).  Physical Exam Physical Exam  Constitutional: He is oriented to person, place, and time. He appears well-developed and well-nourished.  HENT:  Head: Normocephalic and atraumatic.  Eyes: Conjunctivae normal are normal. Pupils are equal, round, and reactive to light.  Neck: Normal range of motion. Neck supple.  Cardiovascular: Normal rate,  regular rhythm and normal heart sounds.   Pulmonary/Chest: Effort normal and breath sounds normal.  Abdominal: Soft. Bowel sounds are normal. A hernia is present. Hernia confirmed positive in the right inguinal area. Hernia confirmed negative in the left inguinal area.  Musculoskeletal: Normal range of motion.  Neurological: He is alert and oriented to person, place, and time.    Data Reviewed none  Assessment    52 year old male with a right inguinal hernia likely direct and indirect    Plan    1. We'll proceed operating room for laparoscopic right anal hernia repair with mesh.  2. All risks and benefits were discussed with the patient, to generally include infection, bleeding, damage to surrounding structures, and recurrence. Alternatives were offered and described.  All questions were answered and the patient voiced understanding of the procedure and wishes to proceed at this point.         Marigene Ehlers., [place] 09/28/2012, 2:11 PM

## 2012-10-17 ENCOUNTER — Encounter (HOSPITAL_COMMUNITY): Payer: Self-pay | Admitting: General Surgery

## 2012-10-17 ENCOUNTER — Telehealth (INDEPENDENT_AMBULATORY_CARE_PROVIDER_SITE_OTHER): Payer: Self-pay | Admitting: General Surgery

## 2012-10-17 NOTE — Telephone Encounter (Signed)
Dr. Derrell Lolling returned call and ordered Percocet 10/325 mg, # 30 (thirty), 1-2 po Q4-6H prn pain, no refill.  Rx signed by Dr. Abbey Chatters.  Pt understands to "make them last" because there will doubtful be any further narcotics issured.  Scripts left at front desk for pickup.

## 2012-10-17 NOTE — Telephone Encounter (Signed)
Pt called to report that he and Dr. Derrell Lolling had "talked about" his getting Percocet 10/325 mg, because the 5/325 mg are not strong enough.  He picked up 5/325 mg prescription and it taking up to 3 at a time for adequate pain control, but he knows this is too much Tylenol.  He stated he gets no relief from Hydrocodone at all, so is not interested in our refilling with it.  Understands we must page Dr. Derrell Lolling for advice, then call him back.

## 2012-10-28 ENCOUNTER — Encounter (INDEPENDENT_AMBULATORY_CARE_PROVIDER_SITE_OTHER): Payer: BC Managed Care – PPO | Admitting: General Surgery

## 2012-10-28 ENCOUNTER — Telehealth (INDEPENDENT_AMBULATORY_CARE_PROVIDER_SITE_OTHER): Payer: Self-pay | Admitting: General Surgery

## 2012-10-28 NOTE — Telephone Encounter (Signed)
LMOM to see if patient could come in sooner for today's appt 2/14...ask patient to return my call

## 2012-10-31 ENCOUNTER — Ambulatory Visit (INDEPENDENT_AMBULATORY_CARE_PROVIDER_SITE_OTHER): Payer: BC Managed Care – PPO | Admitting: General Surgery

## 2012-10-31 ENCOUNTER — Encounter (INDEPENDENT_AMBULATORY_CARE_PROVIDER_SITE_OTHER): Payer: Self-pay | Admitting: General Surgery

## 2012-10-31 VITALS — BP 144/90 | HR 76 | Temp 97.4°F | Resp 16 | Ht 73.0 in | Wt 206.2 lb

## 2012-10-31 DIAGNOSIS — Z9889 Other specified postprocedural states: Secondary | ICD-10-CM

## 2012-10-31 DIAGNOSIS — Z8719 Personal history of other diseases of the digestive system: Secondary | ICD-10-CM

## 2012-10-31 NOTE — Progress Notes (Signed)
Patient ID: Todd Jones, male   DOB: [birth year], 52 y.o.   MRN: [id number] This is a 52 year old male status post right inguinal hernia repair done laparoscopically on 10/14/2012. The patient has been doing well postoperatively aside from some soreness. It was a great relief from his right inguinal area where the hernia was previously.   On exam: His wounds are clean dry and intact There is no hernia on palpation   Assessment and plan: 52 year old male status post lap right inguinal hernia repair. 1. We discussed  Weight  restrictions for one month. 2. The patient will be given a light duty noted work 3. Patient follow apparent

## 2013-01-15 ENCOUNTER — Emergency Department (HOSPITAL_COMMUNITY): Payer: BC Managed Care – PPO

## 2013-01-15 ENCOUNTER — Emergency Department (HOSPITAL_COMMUNITY)
Admission: EM | Admit: 2013-01-15 | Discharge: 2013-01-15 | Disposition: A | Discharge status: Home / Self Care | Payer: BC Managed Care – PPO | Attending: Emergency Medicine | Admitting: Emergency Medicine

## 2013-01-15 ENCOUNTER — Encounter (HOSPITAL_COMMUNITY): Payer: Self-pay | Admitting: Emergency Medicine

## 2013-01-15 DIAGNOSIS — M25422 Effusion, left elbow: Secondary | ICD-10-CM

## 2013-01-15 DIAGNOSIS — S4980XA Other specified injuries of shoulder and upper arm, unspecified arm, initial encounter: Secondary | ICD-10-CM | POA: Insufficient documentation

## 2013-01-15 DIAGNOSIS — Z8719 Personal history of other diseases of the digestive system: Secondary | ICD-10-CM | POA: Insufficient documentation

## 2013-01-15 DIAGNOSIS — Y9389 Activity, other specified: Secondary | ICD-10-CM | POA: Insufficient documentation

## 2013-01-15 DIAGNOSIS — S6990XA Unspecified injury of unspecified wrist, hand and finger(s), initial encounter: Secondary | ICD-10-CM | POA: Insufficient documentation

## 2013-01-15 DIAGNOSIS — S46909A Unspecified injury of unspecified muscle, fascia and tendon at shoulder and upper arm level, unspecified arm, initial encounter: Secondary | ICD-10-CM | POA: Insufficient documentation

## 2013-01-15 DIAGNOSIS — Z87828 Personal history of other (healed) physical injury and trauma: Secondary | ICD-10-CM | POA: Insufficient documentation

## 2013-01-15 DIAGNOSIS — Z8669 Personal history of other diseases of the nervous system and sense organs: Secondary | ICD-10-CM | POA: Insufficient documentation

## 2013-01-15 DIAGNOSIS — I10 Essential (primary) hypertension: Secondary | ICD-10-CM | POA: Insufficient documentation

## 2013-01-15 DIAGNOSIS — IMO0002 Reserved for concepts with insufficient information to code with codable children: Secondary | ICD-10-CM | POA: Insufficient documentation

## 2013-01-15 DIAGNOSIS — M25429 Effusion, unspecified elbow: Secondary | ICD-10-CM | POA: Insufficient documentation

## 2013-01-15 DIAGNOSIS — Z981 Arthrodesis status: Secondary | ICD-10-CM | POA: Insufficient documentation

## 2013-01-15 DIAGNOSIS — S59909A Unspecified injury of unspecified elbow, initial encounter: Secondary | ICD-10-CM | POA: Insufficient documentation

## 2013-01-15 DIAGNOSIS — Y92410 Unspecified street and highway as the place of occurrence of the external cause: Secondary | ICD-10-CM | POA: Insufficient documentation

## 2013-01-15 DIAGNOSIS — Z87891 Personal history of nicotine dependence: Secondary | ICD-10-CM | POA: Insufficient documentation

## 2013-01-15 DIAGNOSIS — Z8739 Personal history of other diseases of the musculoskeletal system and connective tissue: Secondary | ICD-10-CM | POA: Insufficient documentation

## 2013-01-15 MED ORDER — CYCLOBENZAPRINE HCL 10 MG PO TABS: 10.0000 mg | ORAL_TABLET | Freq: Two times a day (BID) | ORAL | Status: DC | PRN

## 2013-01-15 MED ORDER — IBUPROFEN 600 MG PO TABS: 600.0000 mg | ORAL_TABLET | Freq: Four times a day (QID) | ORAL | Status: DC | PRN

## 2013-01-15 MED ORDER — OXYCODONE-ACETAMINOPHEN 5-325 MG PO TABS
1.0000 | ORAL_TABLET | Freq: Once | ORAL | Status: AC
  Administered 2013-01-15: 1 via ORAL
  Filled 2013-01-15: qty 1

## 2013-01-15 MED ORDER — ONDANSETRON 4 MG PO TBDP
4.0000 mg | ORAL_TABLET | Freq: Once | ORAL | Status: DC
  Filled 2013-01-15: qty 1

## 2013-01-15 MED ORDER — OXYCODONE-ACETAMINOPHEN 5-325 MG PO TABS: 1.0000 | ORAL_TABLET | Freq: Three times a day (TID) | ORAL | Status: DC | PRN
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
## 2013-01-15 NOTE — ED Provider Notes (Signed)
Medical screening examination/treatment/procedure(s) were performed by non-physician practitioner and as supervising physician I was immediately available for consultation/collaboration.  Flint Melter, MD 01/15/13 [phone]
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
## 2015-12-15 ENCOUNTER — Emergency Department (HOSPITAL_BASED_OUTPATIENT_CLINIC_OR_DEPARTMENT_OTHER)
Admission: EM | Admit: 2015-12-15 | Discharge: 2015-12-16 | Disposition: A | Discharge status: Home / Self Care | Payer: BLUE CROSS/BLUE SHIELD | Attending: Emergency Medicine | Admitting: Emergency Medicine

## 2015-12-15 ENCOUNTER — Encounter (HOSPITAL_BASED_OUTPATIENT_CLINIC_OR_DEPARTMENT_OTHER): Payer: Self-pay | Admitting: *Deleted

## 2015-12-15 DIAGNOSIS — R51 Headache: Secondary | ICD-10-CM | POA: Diagnosis present

## 2015-12-15 DIAGNOSIS — Z87891 Personal history of nicotine dependence: Secondary | ICD-10-CM | POA: Diagnosis not present

## 2015-12-15 DIAGNOSIS — B349 Viral infection, unspecified: Secondary | ICD-10-CM | POA: Insufficient documentation

## 2015-12-15 DIAGNOSIS — I10 Essential (primary) hypertension: Secondary | ICD-10-CM | POA: Insufficient documentation

## 2015-12-15 DIAGNOSIS — E876 Hypokalemia: Secondary | ICD-10-CM | POA: Diagnosis not present

## 2015-12-15 DIAGNOSIS — R42 Dizziness and giddiness: Secondary | ICD-10-CM | POA: Insufficient documentation

## 2015-12-15 LAB — BASIC METABOLIC PANEL
ANION GAP: 13 (ref 5–15)
BUN: 15 mg/dL (ref 6–20)
CALCIUM: 9 mg/dL (ref 8.9–10.3)
CO2: 23 mmol/L (ref 22–32)
Chloride: 104 mmol/L (ref 101–111)
Creatinine, Ser: 0.95 mg/dL (ref 0.61–1.24)
Glucose, Bld: 118 mg/dL — ABNORMAL HIGH (ref 65–99)
POTASSIUM: 2.8 mmol/L — AB (ref 3.5–5.1)
Sodium: 140 mmol/L (ref 135–145)

## 2015-12-15 LAB — CBC WITH DIFFERENTIAL/PLATELET
BASOS ABS: 0 10*3/uL (ref 0.0–0.1)
BASOS PCT: 0 %
EOS ABS: 0.1 10*3/uL (ref 0.0–0.7)
Eosinophils Relative: 1 %
HEMATOCRIT: 49.7 % (ref 39.0–52.0)
Hemoglobin: 17.2 g/dL — ABNORMAL HIGH (ref 13.0–17.0)
Lymphocytes Relative: 21 %
Lymphs Abs: 3.9 10*3/uL (ref 0.7–4.0)
MCH: 32.1 pg (ref 26.0–34.0)
MCHC: 34.6 g/dL (ref 30.0–36.0)
MCV: 92.9 fL (ref 78.0–100.0)
MONO ABS: 1.3 10*3/uL — AB (ref 0.1–1.0)
Monocytes Relative: 7 %
NEUTROS ABS: 13.7 10*3/uL — AB (ref 1.7–7.7)
NEUTROS PCT: 71 %
PLATELETS: 312 10*3/uL (ref 150–400)
RBC: 5.35 MIL/uL (ref 4.22–5.81)
RDW: 13.9 % (ref 11.5–15.5)
WBC: 19.1 10*3/uL — ABNORMAL HIGH (ref 4.0–10.5)

## 2015-12-15 MED ORDER — SODIUM CHLORIDE 0.9 % IV BOLUS (SEPSIS)
1000.0000 mL | Freq: Once | INTRAVENOUS | Status: AC
  Administered 2015-12-16: 1000 mL via INTRAVENOUS

## 2015-12-15 MED ORDER — MAGNESIUM SULFATE IN D5W 10-5 MG/ML-% IV SOLN
1.0000 g | Freq: Once | INTRAVENOUS | Status: AC
  Administered 2015-12-15: 1 g via INTRAVENOUS
  Filled 2015-12-15: qty 100

## 2015-12-15 MED ORDER — DIAZEPAM 5 MG PO TABS
5.0000 mg | ORAL_TABLET | Freq: Once | ORAL | Status: AC
  Administered 2015-12-16: 5 mg via ORAL
  Filled 2015-12-15: qty 1

## 2015-12-15 MED ORDER — POTASSIUM CHLORIDE 10 MEQ/100ML IV SOLN
10.0000 meq | Freq: Once | INTRAVENOUS | Status: AC
Start: 2015-12-15 — End: 2015-12-16
  Administered 2015-12-16: 10 meq via INTRAVENOUS
  Filled 2015-12-15: qty 100

## 2015-12-15 NOTE — ED Provider Notes (Signed)
  By signing my name below, I, Todd Jones, attest that this documentation has been prepared under the direction and in the presence of Todd Batonourtney F Horton, MD.  Electronically Signed: Arlan OrganAshley Jones, ED Scribe. 12/15/2015. 11:40 PM.   HPI Comments: Todd Jones is a 55 y.o. male with  PMHx of HTN who presents as a direct Emergency Department to Emergency Department transfer [facility] this evening for further evaluation and MRI. Pt c/o constant, ongoing trouble sleeping x 3 days. He also reports feeling off balance- room spinning, subjective fever and chills, 1 episode of vomiting 2 nights ago, diarrhea, generalized weakness, and R sided otalgia. No chest pain, shortness of breath, or abdominal pain. Todd Jones is prescribed Toprol, however, he admits he stopped taking this medication several years ago. When he initially [facility], he was also complaining of vision changes. Patient reports bilateral blurry vision that was worse earlier today. He currently states that his symptoms have resolved. At [facility] his visual acuity was documented at 20/100 and 50/100.  He does wear reading glasses. Patient reports the dizziness is worse with position changes.  I have seen and reevaluated the patient.  He is well-appearing. Neurologically intact. No dysmetria to finger-nose-finger, patient ambulates without ataxia. He can also tandem gait. Visual fields are intact. I have reviewed lab work which reveals a leukocytosis to 19 and hypokalemia with potassium 2.8. Given onset of dizziness was in conjunction vomiting, diarrhea, and chills, have higher suspicion for peripheral vertigo. I have discussed the patient again with the initial evaluating physician. He was concerned given his hypertension and vision changes.  In shared decision-making with the patient and his wife, will treat for peripheral vertigo given recent upper respiratory symptoms. Chest x-ray and urinalysis added. Will defer  MRI for repeat evaluation.  3:39 AM Patient reports general improvement of symptoms. He has been ambulatory without difficulty. He has had no vision changes while the ER here. He does remain persistently hypertensive with a blood pressure of 200s over 100s. He states that he still feels some dizziness but in general is improved. Continued to feel patient's symptoms are most likely related to peripheral etiology; however, given his persistent hypertension and documented vision changes at [facility], will obtain an MRI.  6:42 AM' Patient has had persistently elevated blood pressures. MRI is negative for stroke. Suspect peripheral vertigo in the setting of viral syndrome. Will also start patient on HCTZ and lisinopril. Daily tasks him supplement given. Follow-up with primary physician for repeat blood pressure check and lab work. Patient was given strict return precautions.  After history, exam, and medical workup I feel the patient has been appropriately medically screened and is safe for discharge home. Pertinent diagnoses were discussed with the patient. Patient was given return precautions.   PCP: PROVIDER NOT IN SYSTEM     I personally performed the services described in this documentation, which was scribed in my presence. The recorded information has been reviewed and is accurate.  Vertigo  Essential hypertension  Hypokalemia  Viral syndrome    Todd Batonourtney F Horton, MD 12/16/15 [phone]

## 2015-12-15 NOTE — ED Provider Notes (Signed)
CSN: [id number]     Arrival date & time 12/15/15  2052 History  By signing my name below, I, Todd Jones, attest that this documentation has been prepared under the direction and in the presence of Orlie Dakin, MD Electronically Signed: Evonnie Dawes, ED Scribe 12/15/2015 at 9:28 PM.  Chief Complaint  Patient presents with  . Blurred Vision  . Headache   The history is provided by the patient. No language interpreter was used.   HPI Comments: Todd Jones is a 55 y.o. male with a pmhx of HTN, who presents to the Emergency Department complaining of HA, blurred vision, intermittent vertigo and associated nausea, and sweating x3 days. He states that sx were at its worst yesterday, however he is feelingSomewhat better today. Pt blew out an ear drum around 10 years ago. Dizziness is exacerbated by turning his head. Dizziness is ameliorated by keeping his eyes still. Pt has been unable to sleep for several days. Pt denies ever having such severe sx in the past. Pt denies any CP. No shortness of breath no abdominal pain no other associated symptoms. Pt wears reading glasses, but states his vision has gotten worse over the past 2 days acutely. Denies eye pain. Headache is at left parietal area  Pt was prescribed Toprol, however he is not taking it because it caused him to have severe side effects including facial color change and "eye problems." Pt has not seen a doctor or taken any BP medications in several years.  Pt denies any use of alcohol or cigarettes. Pt states that he likes marijuana but does not use it often.   Past Medical History  Diagnosis Date  . Arthritis   . Hypertension   . GERD (gastroesophageal reflux disease)   . H/O hiatal hernia   . Insomnia    Past Surgical History  Procedure Laterality Date  . Cholecystectomy    . Knee fusion    . Knee arthroscopy      right x 3  . Inguinal hernia repair  10/14/2012    Procedure: LAPAROSCOPIC INGUINAL HERNIA;  Surgeon:  Ralene Ok, MD;  Location: [facility];  Service: General;  Laterality: Right;  . Insertion of mesh  10/14/2012    Procedure: INSERTION OF MESH;  Surgeon: Ralene Ok, MD;  Location: [facility];  Service: General;  Laterality: Right;   Family History  Problem Relation Age of Onset  . Cancer Maternal Grandfather     lung  . Alcohol abuse Maternal Grandfather    Social History  Substance Use Topics  . Smoking status: Former Research scientist (life sciences)  . Smokeless tobacco: Never Used  . Alcohol Use: No    Review of Systems  Constitutional: Negative.   Eyes: Positive for visual disturbance.  Respiratory: Negative.   Cardiovascular: Negative.   Gastrointestinal: Negative.   Musculoskeletal: Negative.   Skin: Negative.   Neurological: Positive for dizziness and headaches.  Psychiatric/Behavioral: Negative.   All other systems reviewed and are negative.   Allergies  Hydrocodone and Toprol xl  Home Medications   Prior to Admission medications   Medication Sig Start Date End Date Taking? Authorizing Provider  cyclobenzaprine (FLEXERIL) 10 MG tablet Take 1 tablet (10 mg total) by mouth 2 (two) times daily as needed for muscle spasms. 01/15/13   Tiffany Carlota Raspberry, PA-C  ibuprofen (ADVIL,MOTRIN) 600 MG tablet Take 1 tablet (600 mg total) by mouth every 6 (six) hours as needed for pain. 01/15/13   Tiffany Carlota Raspberry, PA-C  lisinopril (PRINIVIL,ZESTRIL) 20 MG tablet  Take 20 mg by mouth every morning.    Historical Provider, MD  oxyCODONE-acetaminophen (PERCOCET/ROXICET) 5-325 MG per tablet Take 1 tablet by mouth every 8 (eight) hours as needed for pain. 01/15/13   Delos Haring, PA-C  oxyCODONE-acetaminophen (ROXICET) 5-325 MG per tablet Take 1 tablet by mouth every 4 (four) hours as needed for pain. 10/14/12   Ralene Ok, MD   BP 192/112 mmHg  Pulse 67  Temp(Src) 98.8 F (37.1 C) (Oral)  Resp 18  Ht 6' 1"  (1.854 m)  Wt 195 lb (88.451 kg)  BMI 25.73 kg/m2  SpO2 100% Physical Exam  Constitutional: He is  oriented to person, place, and time. He appears well-developed and well-nourished.  HENT:  Head: Normocephalic and atraumatic.  Right Ear: External ear normal.  Left Ear: External ear normal.  Bilateral tympanic membranes normal  Eyes: Conjunctivae are normal. Pupils are equal, round, and reactive to light.  No papilledema  Neck: Neck supple. No tracheal deviation present. No thyromegaly present.  Cardiovascular: Normal rate and regular rhythm.   No murmur heard. Pulmonary/Chest: Effort normal and breath sounds normal.  Abdominal: Soft. Bowel sounds are normal. He exhibits no distension. There is no tenderness.  Musculoskeletal: Normal range of motion. He exhibits no edema or tenderness.  Neurological: He is alert and oriented to person, place, and time. No cranial nerve deficit. Coordination normal.  Gait normal finger to nose normal Romberg normal pronator drift normal. He Becomes for vertiginous upon changing positions of his head.  Skin: Skin is warm and dry. No rash noted.  Psychiatric: He has a normal mood and affect.  Nursing note and vitals reviewed.   ED Course  Procedures  DIAGNOSTIC STUDIES: Oxygen Saturation is 100% on RA, normal by my interpretation.    COORDINATION OF CARE: 9:20 PM-Discussed treatment plan which includes visual acuity screening and Transfer to [facility] emergency department for further evaluation with pt at bedside and pt agreed to plan.   Labs Review Labs Reviewed - No data to display  Imaging Review No results found. I have personally reviewed and evaluated these images and lab results as part of my medical decision-making.   EKG Interpretation None     Blood has been sent for CBC and be met which is pending and should be available upon his arrival to [facility] emergency department MDM  I feel the patient warrants MRI to check for posterior circulation stroke in light of history of hypertension and visual changes. He'll be transferred to  [facility] emergency department via private vehicle, driven by his wife. Dr.Ray accepting physician. Patient will also need referral to primary care physician and prescription for antihypertensive and close follow-up of blood pressure once discharged. Diagnosis #1 vertigo #2 hypertension #3 medication noncompliance Final diagnoses:  None    I personally performed the services described in this documentation, which was scribed in my presence. The recorded information has been reviewed and considered.      Orlie Dakin, MD 12/15/15 2159

## 2015-12-15 NOTE — ED Notes (Signed)
Pt resting comfortably with spouse at bedside. Denies any pain at this time.

## 2015-12-15 NOTE — ED Notes (Signed)
Headache, blurred vision, intermittent dizziness, sweating, since Thursday. B/P elevated. Pt not currently taking b/p meds. Denies chest pain

## 2015-12-16 ENCOUNTER — Emergency Department (HOSPITAL_COMMUNITY): Payer: BLUE CROSS/BLUE SHIELD

## 2015-12-16 LAB — URINE MICROSCOPIC-ADD ON
RBC / HPF: NONE SEEN RBC/hpf (ref 0–5)
WBC, UA: NONE SEEN WBC/hpf (ref 0–5)

## 2015-12-16 LAB — URINALYSIS, ROUTINE W REFLEX MICROSCOPIC
Glucose, UA: NEGATIVE mg/dL
KETONES UR: NEGATIVE mg/dL
LEUKOCYTES UA: NEGATIVE
NITRITE: NEGATIVE
PH: 5.5 (ref 5.0–8.0)
PROTEIN: NEGATIVE mg/dL
Specific Gravity, Urine: 1.026 (ref 1.005–1.030)

## 2015-12-16 MED ORDER — HYDROCHLOROTHIAZIDE 25 MG PO TABS: 25.0000 mg | ORAL_TABLET | Freq: Every day | ORAL | Status: DC

## 2015-12-16 MED ORDER — POTASSIUM CHLORIDE 10 MEQ/100ML IV SOLN
10.0000 meq | Freq: Once | INTRAVENOUS | Status: AC
  Administered 2015-12-16: 10 meq via INTRAVENOUS
  Filled 2015-12-16: qty 100

## 2015-12-16 MED ORDER — POTASSIUM CHLORIDE CRYS ER 20 MEQ PO TBCR: 20.0000 meq | EXTENDED_RELEASE_TABLET | Freq: Every day | ORAL | Status: DC

## 2015-12-16 MED ORDER — LISINOPRIL 20 MG PO TABS: 20.0000 mg | ORAL_TABLET | Freq: Every day | ORAL | Status: DC

## 2015-12-16 MED ORDER — DIAZEPAM 5 MG PO TABS: 5.0000 mg | ORAL_TABLET | Freq: Three times a day (TID) | ORAL | Status: DC | PRN

## 2015-12-16 MED ORDER — HYDRALAZINE HCL 20 MG/ML IJ SOLN
5.0000 mg | Freq: Once | INTRAMUSCULAR | Status: AC
  Administered 2015-12-16: 5 mg via INTRAVENOUS
  Filled 2015-12-16: qty 1

## 2015-12-16 NOTE — ED Notes (Signed)
Spoke with main lab - they will add on hepatic function panel. 

## 2015-12-16 NOTE — Discharge Instructions (Signed)
Hypokalemia Hypokalemia means that the amount of potassium in the blood is lower than normal.Potassium is a chemical, called an electrolyte, that helps regulate the amount of fluid in the body. It also stimulates muscle contraction and helps nerves function properly.Most of the body's potassium is inside of cells, and only a very small amount is in the blood. Because the amount in the blood is so small, minor changes can be life-threatening. CAUSES  Antibiotics.  Diarrhea or vomiting.  Using laxatives too much, which can cause diarrhea.  Chronic kidney disease.  Water pills (diuretics).  Eating disorders (bulimia).  Low magnesium level.  Sweating a lot. SIGNS AND SYMPTOMS  Weakness.  Constipation.  Fatigue.  Muscle cramps.  Mental confusion.  Skipped heartbeats or irregular heartbeat (palpitations).  Tingling or numbness. DIAGNOSIS  Your health care provider can diagnose hypokalemia with blood tests. In addition to checking your potassium level, your health care provider may also check other lab tests. TREATMENT Hypokalemia can be treated with potassium supplements taken by mouth or adjustments in your current medicines. If your potassium level is very low, you may need to get potassium through a vein (IV) and be monitored in the hospital. A diet high in potassium is also helpful. Foods high in potassium are:  Nuts, such as peanuts and pistachios.  Seeds, such as sunflower seeds and pumpkin seeds.  Peas, lentils, and lima beans.  Whole grain and bran cereals and breads.  Fresh fruit and vegetables, such as apricots, avocado, bananas, cantaloupe, kiwi, oranges, tomatoes, asparagus, and potatoes.  Orange and tomato juices.  Red meats.  Fruit yogurt. HOME CARE INSTRUCTIONS  Take all medicines as prescribed by your health care provider.  Maintain a healthy diet by including nutritious food, such as fruits, vegetables, nuts, whole grains, and lean meats.  If  you are taking a laxative, be sure to follow the directions on the label. SEEK MEDICAL CARE IF:  Your weakness gets worse.  You feel your heart pounding or racing.  You are vomiting or having diarrhea.  You are diabetic and having trouble keeping your blood glucose in the normal range. SEEK IMMEDIATE MEDICAL CARE IF:  You have chest pain, shortness of breath, or dizziness.  You are vomiting or having diarrhea for more than 2 days.  You faint. MAKE SURE YOU:   Understand these instructions.  Will watch your condition.  Will get help right away if you are not doing well or get worse.   This information is not intended to replace advice given to you by your health care provider. Make sure you discuss any questions you have with your health care provider.   Document Released: 08/31/2005 Document Revised: 09/21/2014 Document Reviewed: 03/03/2013 Elsevier Interactive Patient Education 2016 [employer] Vertigo Vertigo means you feel like you or your surroundings are moving when they are not. Vertigo can be dangerous if it occurs when you are at work, driving, or performing difficult activities.  CAUSES  Vertigo occurs when there is a conflict of signals sent to your brain from the visual and sensory systems in your body. There are many different causes of vertigo, including:  Infections, especially in the inner ear.  A bad reaction to a drug or misuse of alcohol and medicines.  Withdrawal from drugs or alcohol.  Rapidly changing positions, such as lying down or rolling over in bed.  A migraine headache.  Decreased blood flow to the brain.  Increased pressure in the brain from a head injury, infection, tumor, or  bleeding. SYMPTOMS  You may feel as though the world is spinning around or you are falling to the ground. Because your balance is upset, vertigo can cause nausea and vomiting. You may have involuntary eye movements (nystagmus). DIAGNOSIS  Vertigo is usually  diagnosed by physical exam. If the cause of your vertigo is unknown, your caregiver may perform imaging tests, such as an MRI scan (magnetic resonance imaging). TREATMENT  Most cases of vertigo resolve on their own, without treatment. Depending on the cause, your caregiver may prescribe certain medicines. If your vertigo is related to body position issues, your caregiver may recommend movements or procedures to correct the problem. In rare cases, if your vertigo is caused by certain inner ear problems, you may need surgery. HOME CARE INSTRUCTIONS   Follow your caregiver's instructions.  Avoid driving.  Avoid operating heavy machinery.  Avoid performing any tasks that would be dangerous to you or others during a vertigo episode.  Tell your caregiver if you notice that certain medicines seem to be causing your vertigo. Some of the medicines used to treat vertigo episodes can actually make them worse in some people. SEEK IMMEDIATE MEDICAL CARE IF:   Your medicines do not relieve your vertigo or are making it worse.  You develop problems with talking, walking, weakness, or using your arms, hands, or legs.  You develop severe headaches.  Your nausea or vomiting continues or gets worse.  You develop visual changes.  A family member notices behavioral changes.  Your condition gets worse. MAKE SURE YOU:  Understand these instructions.  Will watch your condition.  Will get help right away if you are not doing well or get worse.   This information is not intended to replace advice given to you by your health care provider. Make sure you discuss any questions you have with your health care provider.   Document Released: 06/10/2005 Document Revised: 11/23/2011 Document Reviewed: 12/24/2014 Elsevier Interactive Patient Education 2016 [employer]. Hypertension Hypertension, commonly called high blood pressure, is when the force of blood pumping through your arteries is too strong. Your  arteries are the blood vessels that carry blood from your heart throughout your body. A blood pressure reading consists of a higher number over a lower number, such as 110/72. The higher number (systolic) is the pressure inside your arteries when your heart pumps. The lower number (diastolic) is the pressure inside your arteries when your heart relaxes. Ideally you want your blood pressure below 120/80. Hypertension forces your heart to work harder to pump blood. Your arteries may become narrow or stiff. Having untreated or uncontrolled hypertension can cause heart attack, stroke, kidney disease, and other problems. RISK FACTORS Some risk factors for high blood pressure are controllable. Others are not.  Risk factors you cannot control include:   Race. You may be at higher risk if you are African American.  Age. Risk increases with age.  Gender. Men are at higher risk than women before age 47 years. After age 63, women are at higher risk than men. Risk factors you can control include:  Not getting enough exercise or physical activity.  Being overweight.  Getting too much fat, sugar, calories, or salt in your diet.  Drinking too much alcohol. SIGNS AND SYMPTOMS Hypertension does not usually cause signs or symptoms. Extremely high blood pressure (hypertensive crisis) may cause headache, anxiety, shortness of breath, and nosebleed. DIAGNOSIS To check if you have hypertension, your health care provider will measure your blood pressure while you are  seated, with your arm held at the level of your heart. It should be measured at least twice using the same arm. Certain conditions can cause a difference in blood pressure between your right and left arms. A blood pressure reading that is higher than normal on one occasion does not mean that you need treatment. If it is not clear whether you have high blood pressure, you may be asked to return on a different day to have your blood pressure checked  again. Or, you may be asked to monitor your blood pressure at home for 1 or more weeks. TREATMENT Treating high blood pressure includes making lifestyle changes and possibly taking medicine. Living a healthy lifestyle can help lower high blood pressure. You may need to change some of your habits. Lifestyle changes may include:  Following the DASH diet. This diet is high in fruits, vegetables, and whole grains. It is low in salt, red meat, and added sugars.  Keep your sodium intake below 2,300 mg per day.  Getting at least 30-45 minutes of aerobic exercise at least 4 times per week.  Losing weight if necessary.  Not smoking.  Limiting alcoholic beverages.  Learning ways to reduce stress. Your health care provider may prescribe medicine if lifestyle changes are not enough to get your blood pressure under control, and if one of the following is true:  You are 74-18 years of age and your systolic blood pressure is above 140.  You are 55 years of age or older, and your systolic blood pressure is above 150.  Your diastolic blood pressure is above 90.  You have diabetes, and your systolic blood pressure is over 140 or your diastolic blood pressure is over 90.  You have kidney disease and your blood pressure is above 140/90.  You have heart disease and your blood pressure is above 140/90. Your personal target blood pressure may vary depending on your medical conditions, your age, and other factors. HOME CARE INSTRUCTIONS  Have your blood pressure rechecked as directed by your health care provider.   Take medicines only as directed by your health care provider. Follow the directions carefully. Blood pressure medicines must be taken as prescribed. The medicine does not work as well when you skip doses. Skipping doses also puts you at risk for problems.  Do not smoke.   Monitor your blood pressure at home as directed by your health care provider. SEEK MEDICAL CARE IF:   You think  you are having a reaction to medicines taken.  You have recurrent headaches or feel dizzy.  You have swelling in your ankles.  You have trouble with your vision. SEEK IMMEDIATE MEDICAL CARE IF:  You develop a severe headache or confusion.  You have unusual weakness, numbness, or feel faint.  You have severe chest or abdominal pain.  You vomit repeatedly.  You have trouble breathing. MAKE SURE YOU:   Understand these instructions.  Will watch your condition.  Will get help right away if you are not doing well or get worse.   This information is not intended to replace advice given to you by your health care provider. Make sure you discuss any questions you have with your health care provider.   Document Released: 08/31/2005 Document Revised: 01/15/2015 Document Reviewed: 06/23/2013 Elsevier Interactive Patient Education [employer]

## 2015-12-16 NOTE — ED Notes (Signed)
Pt in MRI.

## 2015-12-16 NOTE — ED Notes (Signed)
Pt ambulated in hallway and tolerated well

## 2015-12-24 ENCOUNTER — Telehealth: Payer: Self-pay | Admitting: *Deleted

## 2015-12-24 NOTE — Telephone Encounter (Signed)
Unable to reach patient at time of pre-visit call. Left message for patient to return call when available.  

## 2015-12-25 ENCOUNTER — Encounter: Payer: Self-pay | Admitting: Medical

## 2015-12-25 ENCOUNTER — Ambulatory Visit (INDEPENDENT_AMBULATORY_CARE_PROVIDER_SITE_OTHER): Payer: BLUE CROSS/BLUE SHIELD | Admitting: Medical

## 2015-12-25 VITALS — BP 139/88 | HR 89 | Temp 98.3°F | Resp 16 | Ht 72.75 in | Wt 201.8 lb

## 2015-12-25 DIAGNOSIS — G4700 Insomnia, unspecified: Secondary | ICD-10-CM

## 2015-12-25 DIAGNOSIS — F411 Generalized anxiety disorder: Secondary | ICD-10-CM

## 2015-12-25 DIAGNOSIS — M25561 Pain in right knee: Secondary | ICD-10-CM

## 2015-12-25 DIAGNOSIS — I10 Essential (primary) hypertension: Secondary | ICD-10-CM | POA: Diagnosis not present

## 2015-12-25 MED ORDER — TRAMADOL HCL 50 MG PO TABS: 50.0000 mg | ORAL_TABLET | Freq: Three times a day (TID) | ORAL | Status: DC | PRN

## 2015-12-25 MED ORDER — HYDROCHLOROTHIAZIDE 25 MG PO TABS: 25.0000 mg | ORAL_TABLET | Freq: Every day | ORAL | Status: DC

## 2015-12-25 MED ORDER — DIAZEPAM 5 MG PO TABS: ORAL_TABLET | ORAL | Status: DC

## 2015-12-25 MED ORDER — POTASSIUM CHLORIDE CRYS ER 20 MEQ PO TBCR: 20.0000 meq | EXTENDED_RELEASE_TABLET | Freq: Every day | ORAL | Status: DC

## 2015-12-25 MED ORDER — LISINOPRIL 20 MG PO TABS: 20.0000 mg | ORAL_TABLET | Freq: Every day | ORAL | Status: DC

## 2015-12-25 NOTE — Patient Instructions (Addendum)
For htn. Continue the lisinopril and diuretic.  For anxiety and insomnia. Will rx valium.  For knee pain will rx tramadol for pain. Use sparingly.   Discussion on potential contract for control meds in future.  Follow up 3 weeks or as needed. Do want you to schedule appointment fasting and come in the am.

## 2015-12-25 NOTE — Addendum Note (Signed)
Addended by: Gwenevere AbbotSAGUIER, Trinitie Mcgirr M on: 12/25/2015 12:31 PM   Modules accepted: Level of Service

## 2015-12-25 NOTE — Progress Notes (Signed)
Pre visit review using our clinic review tool, if applicable. No additional management support is needed unless otherwise documented below in the visit note. 

## 2015-12-25 NOTE — Progress Notes (Signed)
Subjective:    Patient ID: Todd Jones, male    DOB: 10-14-1960, 55 y.o.   MRN: [id number]  HPI   I have reviewed pt PMH, PSH, FH, Social History and Surgical History.  Pt married. Wife has stage 4 lung cancer and colon cancer, Can't exercise due to knee pain, Pt is eating healthy since discharge from the hospital, pt drinks coffee. Half a pot to one pot a day. But 1/2 caffeinated and half decaffeinated. Pt has 2 grown children.  Pt has not been regular check ups in some time/years  Htn- when younger in his 20's had high bp. Pt tried toprol in the past. He states his faced turned purple and discoloration to eyes.  Pt states was not treated for years. Then a week ago Monday he was given lisinopril. Pt blood pressure was 232/205 in ED. Got out Monday evening after evaluation at hospital.  He was having vertigo before he went to ED. Then was admitted and MRI was negative. Pt bp is better today. He states prior vertigo has resolved. Pt states 2 days ago had some tinnitus but that to has resolved.  Anxiety and insomnia- pt has been anxious for some time. Related to wife illness and life in general. Just started valium when discharged from hospital.  Pt has arthritis of rt knee. Pt states he needs replacement. He states his knee swells at times. He was told needs replacement but he states does not have time to recover. He need to work.  History of gerd- none since he lost weight. 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 Only rare reflux if eats something spicy. But then rolaids will take care of.    Review of Systems  Constitutional: Negative for fever, chills and diaphoresis.  HENT: Negative for congestion, drooling and ear pain.   Respiratory: Negative for cough, chest tightness, shortness of breath and wheezing.   Cardiovascular: Negative for chest pain and palpitations.  Gastrointestinal: Negative for abdominal pain.  Musculoskeletal:       Rt knee pain.  Skin: Negative  for rash.  Neurological: Negative for dizziness, seizures and weakness.  Hematological: Negative for adenopathy. Does not bruise/bleed easily.  Psychiatric/Behavioral: Positive for sleep disturbance. Negative for behavioral problems and confusion. The patient is nervous/anxious.     Past Medical History  Diagnosis Date  . Arthritis   . Hypertension   . GERD (gastroesophageal reflux disease)   . H/O hiatal hernia   . Insomnia     Social History   Social History  . Marital Status: Married    Spouse Name: N/A  . Number of Children: N/A  . Years of Education: N/A   Occupational History  . Not on file.   Social History Main Topics  . Smoking status: Former Games developer  . Smokeless tobacco: Never Used  . Alcohol Use: No  . Drug Use: Yes    Special: Marijuana  . Sexual Activity: Not on file   Other Topics Concern  . Not on file   Social History Narrative    Past Surgical History  Procedure Laterality Date  . Cholecystectomy    . Knee fusion    . Knee arthroscopy      right x 3  . Inguinal hernia repair  10/14/2012    Procedure: LAPAROSCOPIC INGUINAL HERNIA;  Surgeon: Axel Filler, MD;  Location: [facility];  Service: General;  Laterality: Right;  . Insertion of mesh  10/14/2012    Procedure: INSERTION OF MESH;  Surgeon: Axel FillerArmando Ramirez, MD;  Location: [facility];  Service: General;  Laterality: Right;    Family History  Problem Relation Age of Onset  . Cancer Maternal Grandfather     lung  . Alcohol abuse Maternal Grandfather     Allergies  Allergen Reactions  . Hydrocodone     Stomach upset  . Toprol Xl [Metoprolol Tartrate]     confusion    Current Outpatient Prescriptions on File Prior to Visit  Medication Sig Dispense Refill  . diazepam (VALIUM) 5 MG tablet Take 1 tablet (5 mg total) by mouth every 8 (eight) hours as needed (vertigo). 10 tablet 0  . hydrochlorothiazide (HYDRODIURIL) 25 MG tablet Take 1 tablet (25 mg total) by mouth daily. 30 tablet 0  .  lisinopril (PRINIVIL,ZESTRIL) 20 MG tablet Take 1 tablet (20 mg total) by mouth daily. 30 tablet 0  . potassium chloride SA (K-DUR,KLOR-CON) 20 MEQ tablet Take 1 tablet (20 mEq total) by mouth daily. 30 tablet 0   No current facility-administered medications on file prior to visit.    BP 130/88 mmHg  Pulse 89  Temp(Src) 98.3 F (36.8 C) (Oral)  Resp 16  Ht 6' 0.75" (1.848 m)  Wt 201 lb 12.8 oz (91.536 kg)  BMI 26.80 kg/m2  SpO2 97%       Objective:   Physical Exam  General Mental Status- Alert. General Appearance- Not in acute distress.   Skin General: Color- Normal Color. Moisture- Normal Moisture.  Neck Carotid Arteries- Normal color. Moisture- Normal Moisture. No carotid bruits. No JVD.  Chest and Lung Exam Auscultation: Breath Sounds:-Normal.  Cardiovascular Auscultation:Rythm- Regular. Murmurs & Other Heart Sounds:Auscultation of the heart reveals- No Murmurs.  Abdomen Inspection:-Inspeection Normal. Palpation/Percussion:Note:No mass. Palpation and Percussion of the abdomen reveal- Non Tender, Non Distended + BS, no rebound or guarding.    Neurologic Cranial Nerve exam:- CN III-XII intact(No nystagmus), symmetric smile. Strength:- 5/5 equal and symmetric strength both upper and lower extremities.  Rt knee- crepitus on range of motion.     Assessment & Plan:  For htn. Continue the lisinopril and diuretic.  For anxiety and insomnia. Will rx valium.  For knee pain will rx tramadol for pain. Use sparingly.   Discussion on potential contract for control meds in future.  Follow up 3 weeks or as neededs. Do want you to schedule appointment fasting and come in the am.

## 2016-01-13 ENCOUNTER — Telehealth: Payer: Self-pay | Admitting: Medical

## 2016-01-13 NOTE — Telephone Encounter (Signed)
Relation to YQ:MVHQpt:self Call back [phone] Pharmacy: [facility] - [place] - [address] [phone] (Phone) [phone])         Reason for call:  Patient requesting a refill lisinopril (PRINIVIL,ZESTRIL) 20 MG tablet and hydrochlorothiazide (HYDRODIURIL) 25 MG tablet will run out by 01/16/16 appointment. Please advise

## 2016-01-14 MED ORDER — HYDROCHLOROTHIAZIDE 25 MG PO TABS: 25.0000 mg | ORAL_TABLET | Freq: Every day | ORAL | Status: DC

## 2016-01-14 MED ORDER — LISINOPRIL 20 MG PO TABS: 20.0000 mg | ORAL_TABLET | Freq: Every day | ORAL | Status: DC

## 2016-01-14 NOTE — Telephone Encounter (Signed)
Medications sent to pharmacy for pt on 01/14/16. Left detailed message that medication was sent to the pharmacy on 01/14/16.

## 2016-01-16 ENCOUNTER — Ambulatory Visit (INDEPENDENT_AMBULATORY_CARE_PROVIDER_SITE_OTHER): Payer: BLUE CROSS/BLUE SHIELD | Admitting: Medical

## 2016-01-16 ENCOUNTER — Encounter: Payer: Self-pay | Admitting: Medical

## 2016-01-16 VITALS — BP 128/84 | HR 78 | Temp 98.0°F | Ht 73.0 in | Wt 200.4 lb

## 2016-01-16 DIAGNOSIS — I10 Essential (primary) hypertension: Secondary | ICD-10-CM | POA: Diagnosis not present

## 2016-01-16 DIAGNOSIS — M25561 Pain in right knee: Secondary | ICD-10-CM

## 2016-01-16 DIAGNOSIS — F411 Generalized anxiety disorder: Secondary | ICD-10-CM

## 2016-01-16 DIAGNOSIS — G4700 Insomnia, unspecified: Secondary | ICD-10-CM

## 2016-01-16 MED ORDER — HYDROCHLOROTHIAZIDE 25 MG PO TABS: 25.0000 mg | ORAL_TABLET | Freq: Every day | ORAL | Status: DC

## 2016-01-16 MED ORDER — DIAZEPAM 5 MG PO TABS: ORAL_TABLET | ORAL | Status: DC

## 2016-01-16 MED ORDER — OXYCODONE-ACETAMINOPHEN 7.5-325 MG PO TABS: ORAL_TABLET | ORAL | Status: DC

## 2016-01-16 MED ORDER — LISINOPRIL 20 MG PO TABS: 20.0000 mg | ORAL_TABLET | Freq: Every day | ORAL | Status: DC

## 2016-01-16 MED ORDER — SERTRALINE HCL 25 MG PO TABS: 25.0000 mg | ORAL_TABLET | Freq: Every day | ORAL | Status: DC

## 2016-01-16 NOTE — Patient Instructions (Addendum)
Bp is now much improved. Will continue same medication. Will rx refills today.  For anxiety and insomnia continue valium. Last took last night. Will rx sertraline today as add on. Stop tramadol.  For knee pain will get rt knee xray. Will get drug screen today. Will rx limited number of percocet. Rx advisement given. Give uds today and sign contract.  Follow up 3 months or as needed(that day schedule cpe and come in fasting).

## 2016-01-16 NOTE — Progress Notes (Signed)
Pre visit review using our clinic review tool, if applicable. No additional management support is needed unless otherwise documented below in the visit note. 

## 2016-01-16 NOTE — Progress Notes (Signed)
Subjective:    Patient ID: Todd Jones, male    DOB: [birth year], 55 y.o.   MRN: [id number]  HPI   Pt in for follow up. Pt bp is controlled. No current side effects from med. Initially he thought felt tired but now not the case. No cardiac or neurologic signs or symptoms. Much improved compared to his historic bp reading on review.  Pt states his knees still hurt(moderate to severe daily pain). Pt states tramadol did not help much and he did not like side effects from medication. Made him feel groggy. He does not like the way he feels. Pt last tramadol yesterday.   Pt still having some anxiety and insomnia. He has both related to wife health. She has lung and colon cancer. He sleeps better now on valium 5 mg at night.     Review of Systems  Constitutional: Negative for fever, chills and fatigue.  Respiratory: Negative for cough, chest tightness, shortness of breath and wheezing.   Cardiovascular: Negative for chest pain and palpitations.  Musculoskeletal: Negative for back pain.       Both knee pain. Pt states knee replacement recommended.But can't do know with wife sick. Can't take time off work. Also financial cost.  Skin: Negative for rash.  Neurological: Negative for dizziness and headaches.  Hematological: Negative for adenopathy. Does not bruise/bleed easily.  Psychiatric/Behavioral: Positive for sleep disturbance. Negative for suicidal ideas. The patient is nervous/anxious.     Past Medical History  Diagnosis Date  . Arthritis   . Hypertension   . GERD (gastroesophageal reflux disease)   . H/O hiatal hernia   . Insomnia      Social History   Social History  . Marital Status: Married    Spouse Name: N/A  . Number of Children: N/A  . Years of Education: N/A   Occupational History  . Not on file.   Social History Main Topics  . Smoking status: Former Games developer  . Smokeless tobacco: Never Used  . Alcohol Use: No  . Drug Use: Yes    Special: Marijuana  .  Sexual Activity: Not on file   Other Topics Concern  . Not on file   Social History Narrative    Past Surgical History  Procedure Laterality Date  . Cholecystectomy    . Knee fusion    . Knee arthroscopy      right x 3  . Inguinal hernia repair  10/14/2012    Procedure: LAPAROSCOPIC INGUINAL HERNIA;  Surgeon: Axel Filler, MD;  Location: [facility];  Service: General;  Laterality: Right;  . Insertion of mesh  10/14/2012    Procedure: INSERTION OF MESH;  Surgeon: Axel Filler, MD;  Location: [facility];  Service: General;  Laterality: Right;    Family History  Problem Relation Age of Onset  . Cancer Maternal Grandfather     lung  . Alcohol abuse Maternal Grandfather     Allergies  Allergen Reactions  . Hydrocodone     Stomach upset  . Toprol Xl [Metoprolol Tartrate]     confusion    Current Outpatient Prescriptions on File Prior to Visit  Medication Sig Dispense Refill  . diazepam (VALIUM) 5 MG tablet 1 tab p qs as needed insomnia or anxiety 30 tablet 0  . hydrochlorothiazide (HYDRODIURIL) 25 MG tablet Take 1 tablet (25 mg total) by mouth daily. 30 tablet 0  . lisinopril (PRINIVIL,ZESTRIL) 20 MG tablet Take 1 tablet (20 mg total) by mouth daily. 30 tablet 0  .  potassium chloride SA (K-DUR,KLOR-CON) 20 MEQ tablet Take 1 tablet (20 mEq total) by mouth daily. 30 tablet 0  . traMADol (ULTRAM) 50 MG tablet Take 1 tablet (50 mg total) by mouth every 8 (eight) hours as needed. 30 tablet 0   No current facility-administered medications on file prior to visit.    BP 128/84 mmHg  Pulse 78  Temp(Src) 98 F (36.7 C) (Oral)  Ht 6\' 1"  (1.854 m)  Wt 200 lb 6.4 oz (90.901 kg)  BMI 26.45 kg/m2  SpO2 98%       Objective:   Physical Exam   General Mental Status- Alert. General Appearance- Not in acute distress.   Skin General: Color- Normal Color. Moisture- Normal Moisture.  Neck Carotid Arteries- Normal color. Moisture- Normal Moisture. No carotid bruits. No  JVD.  Chest and Lung Exam Auscultation: Breath Sounds:-Normal.  Cardiovascular Auscultation:Rythm- Regular. Murmurs & Other Heart Sounds:Auscultation of the heart reveals- No Murmurs.  Abdomen Inspection:-Inspeection Normal. Palpation/Percussion:Note:No mass. Palpation and Percussion of the abdomen reveal- Non Tender, Non Distended + BS, no rebound or guarding.    Neurologic Cranial Nerve exam:- CN III-XII intact(No nystagmus), symmetric smile. Strength:- 5/5 equal and symmetric strength both upper and lower extremities.  Rt knee- Pt has rt knee crepitus on flexion and extension.  Lt knee- mild crepitus       Assessment & Plan:  Bp is now much improved. Will continue same medication. Will rx refills today.  For anxiety and insomnia continue valium. Last took last night. Will rx sertraline today as add on. Stop tramadol.  For knee pain will get rt knee xray. Will get drug screen today. Will rx limited number of percocet(prior use and no reaction). Rx advisement given. Give uds today and sign contract.  Follow up 3 months or as needed  Dericka Ostenson, Ramon DredgeEdward, VF CorporationPA-C

## 2016-01-27 ENCOUNTER — Telehealth: Payer: Self-pay | Admitting: Medical

## 2016-01-27 ENCOUNTER — Encounter: Payer: Self-pay | Admitting: Medical

## 2016-01-27 NOTE — Telephone Encounter (Signed)
Left message for patient to call and reschedule appointment with Todd Jones scheduled for April 16, 2016. Letter mailed and appointment cancelled 01/27/2016.

## 2016-02-17 ENCOUNTER — Telehealth: Payer: Self-pay | Admitting: Medical

## 2016-02-17 MED ORDER — OXYCODONE-ACETAMINOPHEN 7.5-325 MG PO TABS: ORAL_TABLET | ORAL | Status: DC

## 2016-02-17 NOTE — Telephone Encounter (Signed)
Can be reached: [phone]  Reason for call: Pt called for refill on percocet. Takes 1/day as he only received 30. He said he was told RX would be for 60 to take 2/day. Pt ok with whichever is sent. Please call when RX ready for pick up. Pt has only enough for today.

## 2016-02-17 NOTE — Telephone Encounter (Signed)
I went over pt drug screen today. He is on valium and percocet. He was positive for thc. I explained to him in future if any further positive thc  then I would not be able to write controlled med. He expressed understanding. He alluded to fact his wife has cancer and uses marijuana. He expressed that he would not smoke or stay in room when she was smoking. Refilled his perocet. He can come by and pick up rx of oxycodone.

## 2016-02-18 MED FILL — OXYCODONE-APAP 7.5/325MG: 7.5-325 | 30 days supply | Qty: 60 | Fill #0

## 2016-02-18 NOTE — Telephone Encounter (Signed)
Best # [phone]   Patient checking on the status of medication script below.

## 2016-02-18 NOTE — Telephone Encounter (Signed)
Patient came in this am to pick up Rx.

## 2016-02-28 ENCOUNTER — Encounter: Payer: Self-pay | Admitting: Medical

## 2016-03-16 ENCOUNTER — Telehealth: Payer: Self-pay | Admitting: Medical

## 2016-03-16 MED ORDER — OXYCODONE-ACETAMINOPHEN 7.5-325 MG PO TABS: ORAL_TABLET | ORAL | Status: DC

## 2016-03-16 MED ORDER — AMOXICILLIN 500 MG PO TABS
500.0000 mg | ORAL_TABLET | Freq: Two times a day (BID) | ORAL | Status: DC
Start: 2016-03-16 — End: 2016-06-23

## 2016-03-16 NOTE — Telephone Encounter (Signed)
°  Relationship to patient: Self  Can be reached: [phone]   Reason for call: Request refill on oxyCODONE-acetaminophen (PERCOCET) 7.5-325 MG tablet [[id number]][[id number]]   Patient also states that he needs an antibiotic for an infected tooth that is causing him to have vertigo.

## 2016-03-16 NOTE — Telephone Encounter (Signed)
Call pt after hours no answer. Will send him amoxicillin to his pharmacy. I printed percocet rx that he can pick up on wed. Not in office now and closed tomorrow. Will you call pt and advise he can pick up rx percocet. But please let me sign it first. He said he had some vertigo. Not sure that is caused by tooth. Offer him appointment on Wednesday if still having dizziness/vertigo.

## 2016-03-18 ENCOUNTER — Telehealth: Payer: Self-pay | Admitting: Medical

## 2016-03-18 MED FILL — OXYCODONE/APAP 7.5/325MG: 7.5-325 | 30 days supply | Qty: 60 | Fill #0

## 2016-03-18 NOTE — Telephone Encounter (Signed)
Pt needs to have appointment. Make sure he knows he needs to have follow up before next refill of pain med.

## 2016-03-18 NOTE — Telephone Encounter (Signed)
Left message for pt that Rx was ready up front for pick up and to call back if he was still having some dizziness or vertigo.

## 2016-03-18 NOTE — Telephone Encounter (Signed)
Pt came in to the office to pick up Rx for Percocet and pt did not set up an appointment.

## 2016-04-13 ENCOUNTER — Telehealth: Payer: Self-pay | Admitting: Medical

## 2016-04-13 MED ORDER — LISINOPRIL 20 MG PO TABS: 20.0000 mg | ORAL_TABLET | Freq: Every day | ORAL | 0 refills | Status: DC

## 2016-04-13 MED ORDER — HYDROCHLOROTHIAZIDE 25 MG PO TABS: 25.0000 mg | ORAL_TABLET | Freq: Every day | ORAL | 0 refills | Status: DC

## 2016-04-13 NOTE — Telephone Encounter (Signed)
Relation to SE:LTRV Call back number:[phone] Pharmacy: [facility] - [place], [place] - [address] [phone] (Phone) [phone] (Fax)     Reason for call:  Patient requesting a refill lisinopril (PRINIVIL,ZESTRIL) 20 MG tablet, hydrochlorothiazide (HYDRODIURIL) 25 MG tablet, oxyCODONE-acetaminophen (PERCOCET) 7.5-325 MG tablet

## 2016-04-13 NOTE — Telephone Encounter (Signed)
Left message for pt to call back  °

## 2016-04-15 MED ORDER — SERTRALINE HCL 25 MG PO TABS: 25.0000 mg | ORAL_TABLET | Freq: Every day | ORAL | 0 refills | Status: DC

## 2016-04-15 NOTE — Addendum Note (Signed)
Addended by: Neldon Labella on: 04/15/2016 04:28 PM   Modules accepted: Orders

## 2016-04-15 NOTE — Telephone Encounter (Signed)
Spoke with pt and advised him that his Rx for Perocet would be ready next week after Ramon Dredge gets back. Pt did not have any further questions.

## 2016-04-16 ENCOUNTER — Ambulatory Visit: Payer: BLUE CROSS/BLUE SHIELD | Admitting: Medical

## 2016-04-29 ENCOUNTER — Other Ambulatory Visit: Payer: Self-pay

## 2016-04-29 ENCOUNTER — Ambulatory Visit (INDEPENDENT_AMBULATORY_CARE_PROVIDER_SITE_OTHER): Payer: BLUE CROSS/BLUE SHIELD | Admitting: Medical

## 2016-04-29 ENCOUNTER — Encounter: Payer: Self-pay | Admitting: Medical

## 2016-04-29 VITALS — BP 130/89 | HR 75 | Temp 98.6°F | Ht 73.0 in | Wt 199.0 lb

## 2016-04-29 DIAGNOSIS — D72829 Elevated white blood cell count, unspecified: Secondary | ICD-10-CM

## 2016-04-29 DIAGNOSIS — R252 Cramp and spasm: Secondary | ICD-10-CM

## 2016-04-29 DIAGNOSIS — G4700 Insomnia, unspecified: Secondary | ICD-10-CM

## 2016-04-29 DIAGNOSIS — M25561 Pain in right knee: Secondary | ICD-10-CM | POA: Diagnosis not present

## 2016-04-29 DIAGNOSIS — R0789 Other chest pain: Secondary | ICD-10-CM

## 2016-04-29 DIAGNOSIS — I10 Essential (primary) hypertension: Secondary | ICD-10-CM

## 2016-04-29 DIAGNOSIS — F411 Generalized anxiety disorder: Secondary | ICD-10-CM

## 2016-04-29 LAB — COMPREHENSIVE METABOLIC PANEL
ALBUMIN: 4.8 g/dL (ref 3.5–5.2)
ALT: 28 U/L (ref 0–53)
AST: 19 U/L (ref 0–37)
Alkaline Phosphatase: 50 U/L (ref 39–117)
BUN: 11 mg/dL (ref 6–23)
CALCIUM: 10.2 mg/dL (ref 8.4–10.5)
CHLORIDE: 99 meq/L (ref 96–112)
CO2: 32 mEq/L (ref 19–32)
Creatinine, Ser: 0.96 mg/dL (ref 0.40–1.50)
GFR: 85.88 mL/min (ref >60.00)
Glucose, Bld: 120 mg/dL — ABNORMAL HIGH (ref 70–99)
POTASSIUM: 4.5 meq/L (ref 3.5–5.1)
SODIUM: 137 meq/L (ref 135–145)
Total Bilirubin: 0.4 mg/dL (ref 0.2–1.2)
Total Protein: 7.3 g/dL (ref 6.0–8.3)

## 2016-04-29 LAB — CBC WITH DIFFERENTIAL/PLATELET
BASOS ABS: 0 10*3/uL (ref 0.0–0.1)
Basophils Relative: 0.3 % (ref 0.0–3.0)
EOS PCT: 1.1 % (ref 0.0–5.0)
Eosinophils Absolute: 0.1 10*3/uL (ref 0.0–0.7)
HEMATOCRIT: 47.2 % (ref 39.0–52.0)
HEMOGLOBIN: 16.1 g/dL (ref 13.0–17.0)
LYMPHS PCT: 20.9 % (ref 12.0–46.0)
Lymphs Abs: 2.1 10*3/uL (ref 0.7–4.0)
MCHC: 34.1 g/dL (ref 30.0–36.0)
MCV: 93.9 fl (ref 78.0–100.0)
MONOS PCT: 7.9 % (ref 3.0–12.0)
Monocytes Absolute: 0.8 10*3/uL (ref 0.1–1.0)
Neutro Abs: 7.1 10*3/uL (ref 1.4–7.7)
Neutrophils Relative %: 69.8 % (ref 43.0–77.0)
Platelets: 276 10*3/uL (ref 150.0–400.0)
RBC: 5.03 Mil/uL (ref 4.22–5.81)
RDW: 13.8 % (ref 11.5–15.5)
WBC: 10.1 10*3/uL (ref 4.0–10.5)

## 2016-04-29 LAB — TROPONIN I: TNIDX: 0 ug/l (ref 0.00–0.06)

## 2016-04-29 LAB — MAGNESIUM: Magnesium: 2.2 mg/dL (ref 1.5–2.5)

## 2016-04-29 MED ORDER — OXYCODONE-ACETAMINOPHEN 7.5-325 MG PO TABS: ORAL_TABLET | ORAL | 0 refills | Status: DC

## 2016-04-29 MED ORDER — SERTRALINE HCL 50 MG PO TABS: 50.0000 mg | ORAL_TABLET | Freq: Every day | ORAL | 3 refills | Status: DC

## 2016-04-29 MED ORDER — DIAZEPAM 5 MG PO TABS: 5.0000 mg | ORAL_TABLET | Freq: Two times a day (BID) | ORAL | 1 refills | Status: DC | PRN

## 2016-04-29 MED FILL — OXYCODONE-APAP 7.5-325 MG: 7.5-325 | 30 days supply | Qty: 60 | Fill #0

## 2016-04-29 MED FILL — diazePAM 5 MG TABS: 5 | 15 days supply | Qty: 30 | Fill #0

## 2016-04-29 NOTE — Progress Notes (Signed)
Subjective:    Patient ID: Todd Jones, male    DOB: 07/05/1961, 55 y.o.   MRN: [id number]  HPI  Pt bp is fairly well controlled today. Pt states usually with office visits bp is a little high. Pt has bp meds available. No cardiac or neurologic signs or symptoms.  Pt ran out of perocet on 04-18-2016. Pt states his knees still hurt(moderate to severe daily pain). Pt states tramadol did not help much and he did not like side effects from medication. Made him feel groggy. He does not like the way he feels. Pt last tramadol yesterday. Pt allergic to hydrocodone. Pt told needs knee replacement but can't get done due to his wife illness. She has cancer. He can't miss work due to hospital bills.   Pt has been seen for his vertigo and ringing ear hx. Seen in ED. This has gotten a lot better. Know only rare and transient. Much improved.  Pt has some anxiety and panic attacks. He is on sertraline 25 mg. He is valium mostly at night.(He ran out of med about 3 days ago).  Some muscle cramps. He is having in thighs calfs and feet. This has been going on and off past 2 months.      Review of Systems  Constitutional: Negative for chills, fatigue and fever.  HENT: Negative for congestion, ear pain, nosebleeds, postnasal drip and sinus pressure.   Respiratory: Negative for cough, chest tightness, shortness of breath and wheezing.   Cardiovascular: Negative for chest pain and palpitations.  Gastrointestinal: Negative for abdominal pain and anal bleeding.  Musculoskeletal:       Knee pain.  Skin: Negative for rash.  Neurological: Negative for dizziness and headaches.  Hematological: Negative for adenopathy. Does not bruise/bleed easily.  Psychiatric/Behavioral: Negative for behavioral problems and confusion.     Past Medical History:  Diagnosis Date  . Arthritis   . GERD (gastroesophageal reflux disease)   . H/O hiatal hernia   . Hypertension   . Insomnia      Social History   Social  History  . Marital status: Married    Spouse name: N/A  . Number of children: N/A  . Years of education: N/A   Occupational History  . Not on file.   Social History Main Topics  . Smoking status: Former Games developermoker  . Smokeless tobacco: Never Used  . Alcohol use No  . Drug use:     Types: Marijuana  . Sexual activity: Not on file   Other Topics Concern  . Not on file   Social History Narrative  . No narrative on file    Past Surgical History:  Procedure Laterality Date  . CHOLECYSTECTOMY    . INGUINAL HERNIA REPAIR  10/14/2012   Procedure: LAPAROSCOPIC INGUINAL HERNIA;  Surgeon: Axel FillerArmando Ramirez, MD;  Location: [facility];  Service: General;  Laterality: Right;  . INSERTION OF MESH  10/14/2012   Procedure: INSERTION OF MESH;  Surgeon: Axel FillerArmando Ramirez, MD;  Location: [facility];  Service: General;  Laterality: Right;  . KNEE ARTHROSCOPY     right x 3  . KNEE FUSION      Family History  Problem Relation Age of Onset  . Cancer Maternal Grandfather     lung  . Alcohol abuse Maternal Grandfather     Allergies  Allergen Reactions  . Hydrocodone     Stomach upset  . Toprol Xl [Metoprolol Tartrate]     confusion    Current Outpatient Prescriptions  on File Prior to Visit  Medication Sig Dispense Refill  . amoxicillin (AMOXIL) 500 MG tablet Take 1 tablet (500 mg total) by mouth 2 (two) times daily. 20 tablet 0  . diazepam (VALIUM) 5 MG tablet 1 tab p qs as needed insomnia or anxiety 30 tablet 2  . hydrochlorothiazide (HYDRODIURIL) 25 MG tablet Take 1 tablet (25 mg total) by mouth daily. 30 tablet 0  . lisinopril (PRINIVIL,ZESTRIL) 20 MG tablet Take 1 tablet (20 mg total) by mouth daily. 30 tablet 0  . oxyCODONE-acetaminophen (PERCOCET) 7.5-325 MG tablet 1 tab po q 12 hours prn pain 60 tablet 0  . potassium chloride SA (K-DUR,KLOR-CON) 20 MEQ tablet Take 1 tablet (20 mEq total) by mouth daily. 30 tablet 0  . sertraline (ZOLOFT) 25 MG tablet Take 1 tablet (25 mg total) by mouth daily.  30 tablet 0   No current facility-administered medications on file prior to visit.     BP 130/89   Pulse 75   Temp 98.6 F (37 C) (Oral)   Ht 6\' 1"  (1.854 m)   Wt 199 lb (90.3 kg)   SpO2 99%   BMI 26.25 kg/m       Objective:   Physical Exam  General Mental Status- Alert. General Appearance- Not in acute distress.   Skin General: Color- Normal Color. Moisture- Normal Moisture.  Neck Carotid Arteries- Normal color. Moisture- Normal Moisture. No carotid bruits. No JVD.  Chest and Lung Exam Auscultation: Breath Sounds:-Normal.  Cardiovascular Auscultation:Rythm- Regular. Murmurs & Other Heart Sounds:Auscultation of the heart reveals- No Murmurs.  Abdomen Inspection:-Inspeection Normal. Palpation/Percussion:Note:No mass. Palpation and Percussion of the abdomen reveal- Non Tender, Non Distended + BS, no rebound or guarding.    Neurologic Cranial Nerve exam:- CN III-XII intact(No nystagmus), symmetric smile. Strength:- 5/5 equal and symmetric strength both upper and lower extremities.  Rt knee- moderate to severe crepitus. 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      Assessment & Plan:  BP controlled today. Mild borderline which is typical in office reading per your report. Check bp at home on occasion to make sure less than 140/90 consistently.  For insominia and anxiety will refill your valium.  For your anxiety will rx sertraline but increase the dose.  For knee pain chronic and from severe arthritis that will require knee replacement rx percocet.  For leg cramps get labs.  For hx of wbc increase repeat cbc.  Follow date to be determined after lab review.  Pt at very end of exam mentioned mild chest pain that lasted 5 minutes last night. Resolved last night  with no residual symptoms today or any associated type cardiac symptoms. His ekg looked very similar to one in 2014. Sinus Rhythm. Advised if pain returns then ED evaluation. Will get  troponin stat. Will advise pt after troponin comes back to schedule cpe  1 month  to get lipid panel and stat to assess risk factors. Then decide if other measures need to be taken/referrals.  Kellyn Mansfield, Ramon DredgeEdward, PA-C

## 2016-04-29 NOTE — Progress Notes (Signed)
Pre visit review using our clinic tool,if applicable. No additional management support is needed unless otherwise documented below in the visit note.  

## 2016-04-29 NOTE — Patient Instructions (Addendum)
BP controlled today. Mild borderline which is typical in office reading per your report. Check bp at home on occasion to make sure less than 140/90 consistently.  For insominia and anxiety will refill your valium.  For your anxiety will rx sertraline but increase the dose.  For knee pain chronic and from severe arthritis that will require knee replacement rx percocet.  For leg cramps get labs.  For hx of wbc increase repeat cbc.  Follow date to be determined after lab review.

## 2016-04-30 NOTE — Telephone Encounter (Signed)
When you call pt about labs. And about muscle cramps and k level. Will you clarify with him how long he has been out of his k tab. I don't think he need k-tabs. But wanted to know how long he had been off of the tabs before deciding.

## 2016-05-01 NOTE — Telephone Encounter (Signed)
Called patient left message for return call on answering machine.

## 2016-05-05 NOTE — Telephone Encounter (Signed)
Patient returning call best # [phone] please leave detail message due to patient being at work;.

## 2016-05-05 NOTE — Telephone Encounter (Signed)
Left 2nd message on patients answering machine for return call.

## 2016-05-05 NOTE — Telephone Encounter (Signed)
Called patient at number given, left message with specific information needed from him in the event he calls back I can't speak with him.

## 2016-05-11 NOTE — Telephone Encounter (Signed)
I had saw that pt k level was normal. And his mg level was normal. I had advised through [facility] I wanted to evaluate pt in office. Consider vascular studies to evaluate blood flow as flow of cramps.Since his electrolytes were all normal. I remember her passing message to me and I advised this. She then told pt. I don't see result note or she made comment to this effect in the chart. Do you see that note.  Looks like last rx of k was in April. If he has not been on since then and his potassium is good like recent study then I don't think he needs. When he is in to check pulses of leg. Could repeat cmp. Will you send letter since he has not been answering phone. Maybe try one more time by phone?

## 2016-05-11 NOTE — Telephone Encounter (Signed)
I have not been able to reach this patient after making several attempts.

## 2016-05-20 NOTE — Telephone Encounter (Signed)
Spoke to the patient this am 05/20/16 informed of results from August.  States having no cramps and not had potassium since May.  Will see provider later this month to discuss. Does not want refill

## 2016-05-28 ENCOUNTER — Telehealth: Payer: Self-pay | Admitting: Medical

## 2016-05-28 NOTE — Telephone Encounter (Signed)
Relation to ZO:XWRUpt:self Call back number: Pharmacy:  Reason for call:  Patient requesting a refill oxyCODONE-acetaminophen (PERCOCET) 7.5-325 MG tablet and diazepam (VALIUM) 5 MG tablet

## 2016-05-29 MED ORDER — OXYCODONE-ACETAMINOPHEN 7.5-325 MG PO TABS: ORAL_TABLET | ORAL | 0 refills | Status: DC

## 2016-05-29 NOTE — Telephone Encounter (Signed)
I called pt today and advised him he could pick up oxycodone rx on Monday. Then follow up on wed with me.

## 2016-05-29 NOTE — Telephone Encounter (Signed)
Patient checking on the status of message below. Best # [phone]

## 2016-06-01 ENCOUNTER — Telehealth: Payer: Self-pay | Admitting: Medical

## 2016-06-01 MED FILL — OXYCODONE-APAP 7.5-325 MG: 7.5-325 | 30 days supply | Qty: 60 | Fill #0

## 2016-06-01 NOTE — Telephone Encounter (Signed)
I went to [employer] website. That prescriptin in august for valium was filled at our pharmacy. Will you call down stairs and confirm that he has a refill. Please let me know what you find out.  Also [facility] meds. This refill request came from [facility]. He should be getting controlled meds from one pharmacy only.

## 2016-06-01 NOTE — Telephone Encounter (Signed)
Called Pharmacy regarding Rx for Valium. Last filled by pharmacy 03/2016. Looked at Refill dated 04/2016. Not confirmed by pharmacy. Don't see note stating pick up at front desk.

## 2016-06-01 NOTE — Telephone Encounter (Signed)
Pt came in to pick up RX for oxycodone. He asked about diazepam RX. It appears RX sent in August had a refill but pts pharmacy told him it did not. Please call [facility] to f/u. Thank you.

## 2016-06-02 NOTE — Telephone Encounter (Signed)
Patient requesting office call pharmacy due to medication mentioned below. Please advise.

## 2016-06-03 ENCOUNTER — Encounter: Payer: BLUE CROSS/BLUE SHIELD | Admitting: Medical

## 2016-06-03 MED FILL — diazePAM 5 MG TABS: 5 | 15 days supply | Qty: 30 | Fill #1

## 2016-06-05 NOTE — Telephone Encounter (Signed)
Called patient left message for return call. Called pharmacy who states they do not have Rx on hold for patient. States they last filled on 03/2016. No RX at front to be picked up.

## 2016-06-08 ENCOUNTER — Other Ambulatory Visit: Payer: Self-pay

## 2016-06-19 ENCOUNTER — Telehealth: Payer: Self-pay | Admitting: Medical

## 2016-06-19 ENCOUNTER — Other Ambulatory Visit: Payer: Self-pay

## 2016-06-19 MED ORDER — HYDROCHLOROTHIAZIDE 25 MG PO TABS: 25.0000 mg | ORAL_TABLET | Freq: Every day | ORAL | 0 refills | Status: DC

## 2016-06-19 MED ORDER — LISINOPRIL 20 MG PO TABS: 20.0000 mg | ORAL_TABLET | Freq: Every day | ORAL | 0 refills | Status: DC

## 2016-06-19 MED ORDER — SERTRALINE HCL 50 MG PO TABS: 50.0000 mg | ORAL_TABLET | Freq: Every day | ORAL | 3 refills | Status: DC

## 2016-06-19 NOTE — Telephone Encounter (Signed)
Medication refilled

## 2016-06-19 NOTE — Telephone Encounter (Signed)
Caller name: Relationship to patient: Self Can be reached:[phone]  Pharmacy:  [facility] - [place],  - [address] [phone] (Phone) [phone])     Reason for call: Request refill hydrochlorothiazide (HYDRODIURIL) 25 MG tablet [[id number]][[id number]]   lisinopril (PRINIVIL,ZESTRIL) 20 MG tablet [[id number]][[id number]]   sertraline (ZOLOFT) 50 MG tablet [[id number]][[id number]]

## 2016-06-23 ENCOUNTER — Ambulatory Visit (INDEPENDENT_AMBULATORY_CARE_PROVIDER_SITE_OTHER): Payer: BLUE CROSS/BLUE SHIELD | Admitting: Medical

## 2016-06-23 ENCOUNTER — Encounter: Payer: Self-pay | Admitting: Medical

## 2016-06-23 VITALS — BP 118/80 | HR 66 | Temp 98.1°F | Ht 73.0 in | Wt 201.2 lb

## 2016-06-23 DIAGNOSIS — R739 Hyperglycemia, unspecified: Secondary | ICD-10-CM | POA: Diagnosis not present

## 2016-06-23 DIAGNOSIS — Z0000 Encounter for general adult medical examination without abnormal findings: Secondary | ICD-10-CM | POA: Diagnosis not present

## 2016-06-23 DIAGNOSIS — Z1211 Encounter for screening for malignant neoplasm of colon: Secondary | ICD-10-CM | POA: Diagnosis not present

## 2016-06-23 DIAGNOSIS — Z113 Encounter for screening for infections with a predominantly sexual mode of transmission: Secondary | ICD-10-CM | POA: Diagnosis not present

## 2016-06-23 DIAGNOSIS — Z125 Encounter for screening for malignant neoplasm of prostate: Secondary | ICD-10-CM | POA: Diagnosis not present

## 2016-06-23 LAB — CBC WITH DIFFERENTIAL/PLATELET
Basophils Absolute: 0 10*3/uL (ref 0.0–0.1)
Basophils Relative: 0.5 % (ref 0.0–3.0)
EOS ABS: 0.1 10*3/uL (ref 0.0–0.7)
EOS PCT: 1.6 % (ref 0.0–5.0)
HCT: 44.1 % (ref 39.0–52.0)
HEMOGLOBIN: 15.3 g/dL (ref 13.0–17.0)
Lymphocytes Relative: 25.6 % (ref 12.0–46.0)
Lymphs Abs: 2 10*3/uL (ref 0.7–4.0)
MCHC: 34.7 g/dL (ref 30.0–36.0)
MCV: 92.6 fl (ref 78.0–100.0)
MONO ABS: 0.6 10*3/uL (ref 0.1–1.0)
Monocytes Relative: 7.1 % (ref 3.0–12.0)
Neutro Abs: 5.2 10*3/uL (ref 1.4–7.7)
Neutrophils Relative %: 65.2 % (ref 43.0–77.0)
Platelets: 238 10*3/uL (ref 150.0–400.0)
RBC: 4.76 Mil/uL (ref 4.22–5.81)
RDW: 13.2 % (ref 11.5–15.5)
WBC: 7.9 10*3/uL (ref 4.0–10.5)

## 2016-06-23 LAB — POC URINALSYSI DIPSTICK (AUTOMATED)
BILIRUBIN UA: NEGATIVE
Glucose, UA: NEGATIVE
KETONES UA: NEGATIVE
LEUKOCYTES UA: NEGATIVE
Nitrite, UA: NEGATIVE
PH UA: 5.5
PROTEIN UA: NEGATIVE
RBC UA: NEGATIVE
SPEC GRAV UA: 1.025
Urobilinogen, UA: 0.2

## 2016-06-23 LAB — COMPREHENSIVE METABOLIC PANEL
ALK PHOS: 48 U/L (ref 39–117)
ALT: 18 U/L (ref 0–53)
AST: 14 U/L (ref 0–37)
Albumin: 4.2 g/dL (ref 3.5–5.2)
BUN: 12 mg/dL (ref 6–23)
CO2: 30 mEq/L (ref 19–32)
Calcium: 9.6 mg/dL (ref 8.4–10.5)
Chloride: 101 mEq/L (ref 96–112)
Creatinine, Ser: 0.91 mg/dL (ref 0.40–1.50)
GFR: 91.3 mL/min (ref >60.00)
Glucose, Bld: 99 mg/dL (ref 70–99)
POTASSIUM: 4.4 meq/L (ref 3.5–5.1)
SODIUM: 139 meq/L (ref 135–145)
TOTAL PROTEIN: 6.4 g/dL (ref 6.0–8.3)
Total Bilirubin: 0.4 mg/dL (ref 0.2–1.2)

## 2016-06-23 LAB — LIPID PANEL
CHOLESTEROL: 192 mg/dL (ref 0–200)
HDL: 41.2 mg/dL (ref >39.00)
LDL Cholesterol: 125 mg/dL — ABNORMAL HIGH (ref 0–99)
NonHDL: 151.29
TRIGLYCERIDES: 129 mg/dL (ref 0.0–149.0)
Total CHOL/HDL Ratio: 5
VLDL: 25.8 mg/dL (ref 0.0–40.0)

## 2016-06-23 LAB — PSA: PSA: 0.76 ng/mL (ref 0.10–4.00)

## 2016-06-23 LAB — TSH: TSH: 0.76 u[IU]/mL (ref 0.35–4.50)

## 2016-06-23 LAB — HEMOGLOBIN A1C: HEMOGLOBIN A1C: 5.3 % (ref 4.6–6.5)

## 2016-06-23 NOTE — Patient Instructions (Addendum)
For your wellness exam will get cbc, cmp, tsh, lipid panel, psa,  hiv and ua dip.  You declined flu vaccine today. If you change your mind just let us know.   Will refer you for colonoscopy. Anderson Malta or Steffanie Dunn are contact persons to call if you don't get call from GI.  Follow up date to be determined after lab review.   Preventive Care for Adults, Male A healthy lifestyle and preventive care can promote health and wellness. Preventive health guidelines for men include the following key practices:  A routine yearly physical is a good way to check with your health care provider about your health and preventative screening. It is a chance to share any concerns and updates on your health and to receive a thorough exam.  Visit your dentist for a routine exam and preventative care every 6 months. Brush your teeth twice a day and floss once a day. Good oral hygiene prevents tooth decay and gum disease.  The frequency of eye exams is based on your age, health, family medical history, use of contact lenses, and other factors. Follow your health care provider's recommendations for frequency of eye exams.  Eat a healthy diet. Foods such as vegetables, fruits, whole grains, low-fat dairy products, and lean protein foods contain the nutrients you need without too many calories. Decrease your intake of foods high in solid fats, added sugars, and salt. Eat the right amount of calories for you.Get information about a proper diet from your health care provider, if necessary.  Regular physical exercise is one of the most important things you can do for your health. Most adults should get at least 150 minutes of moderate-intensity exercise (any activity that increases your heart rate and causes you to sweat) each week. In addition, most adults need muscle-strengthening exercises on 2 or more days a week.  Maintain a healthy weight. The body mass index (BMI) is a screening tool to identify possible weight  problems. It provides an estimate of body fat based on height and weight. Your health care provider can find your BMI and can help you achieve or maintain a healthy weight.For adults 20 years and older:  A BMI below 18.5 is considered underweight.  A BMI of 18.5 to 24.9 is normal.  A BMI of 25 to 29.9 is considered overweight.  A BMI of 30 and above is considered obese.  Maintain normal blood lipids and cholesterol levels by exercising and minimizing your intake of saturated fat. Eat a balanced diet with plenty of fruit and vegetables. Blood tests for lipids and cholesterol should begin at age 46 and be repeated every 5 years. If your lipid or cholesterol levels are high, you are over 50, or you are at high risk for heart disease, you may need your cholesterol levels checked more frequently.Ongoing high lipid and cholesterol levels should be treated with medicines if diet and exercise are not working.  If you smoke, find out from your health care provider how to quit. If you do not use tobacco, do not start.  Lung cancer screening is recommended for adults aged 58-80 years who are at high risk for developing lung cancer because of a history of smoking. A yearly low-dose CT scan of the lungs is recommended for people who have at least a 30-pack-year history of smoking and are a current smoker or have quit within the past 15 years. A pack year of smoking is smoking an average of 1 pack of cigarettes a day for  1 year (for example: 1 pack a day for 30 years or 2 packs a day for 15 years). Yearly screening should continue until the smoker has stopped smoking for at least 15 years. Yearly screening should be stopped for people who develop a health problem that would prevent them from having lung cancer treatment.  If you choose to drink alcohol, do not have more than 2 drinks per day. One drink is considered to be 12 ounces (355 mL) of beer, 5 ounces (148 mL) of wine, or 1.5 ounces (44 mL) of  liquor.  Avoid use of street drugs. Do not share needles with anyone. Ask for help if you need support or instructions about stopping the use of drugs.  High blood pressure causes heart disease and increases the risk of stroke. Your blood pressure should be checked at least every 1-2 years. Ongoing high blood pressure should be treated with medicines, if weight loss and exercise are not effective.  If you are 29-65 years old, ask your health care provider if you should take aspirin to prevent heart disease.  Diabetes screening is done by taking a blood sample to check your blood glucose level after you have not eaten for a certain period of time (fasting). If you are not overweight and you do not have risk factors for diabetes, you should be screened once every 3 years starting at age 24. If you are overweight or obese and you are 47-46 years of age, you should be screened for diabetes every year as part of your cardiovascular risk assessment.  Colorectal cancer can be detected and often prevented. Most routine colorectal cancer screening begins at the age of 29 and continues through age 46. However, your health care provider may recommend screening at an earlier age if you have risk factors for colon cancer. On a yearly basis, your health care provider may provide home test kits to check for hidden blood in the stool. Use of a small camera at the end of a tube to directly examine the colon (sigmoidoscopy or colonoscopy) can detect the earliest forms of colorectal cancer. Talk to your health care provider about this at age 29, when routine screening begins. Direct exam of the colon should be repeated every 5-10 years through age 67, unless early forms of precancerous polyps or small growths are found.  People who are at an increased risk for hepatitis B should be screened for this virus. You are considered at high risk for hepatitis B if:  You were born in a country where hepatitis B occurs often. Talk  with your health care provider about which countries are considered high risk.  Your parents were born in a high-risk country and you have not received a shot to protect against hepatitis B (hepatitis B vaccine).  You have HIV or AIDS.  You use needles to inject street drugs.  You live with, or have sex with, someone who has hepatitis B.  You are a man who has sex with other men (MSM).  You get hemodialysis treatment.  You take certain medicines for conditions such as cancer, organ transplantation, and autoimmune conditions.  Hepatitis C blood testing is recommended for all people born from [birth year] through 1965 and any individual with known risks for hepatitis C.  Practice safe sex. Use condoms and avoid high-risk sexual practices to reduce the spread of sexually transmitted infections (STIs). STIs include gonorrhea, chlamydia, syphilis, trichomonas, herpes, HPV, and human immunodeficiency virus (HIV). Herpes, HIV, and HPV are viral  illnesses that have no cure. They can result in disability, cancer, and death.  If you are a man who has sex with other men, you should be screened at least once per year for:  HIV.  Urethral, rectal, and pharyngeal infection of gonorrhea, chlamydia, or both.  If you are at risk of being infected with HIV, it is recommended that you take a prescription medicine daily to prevent HIV infection. This is called preexposure prophylaxis (PrEP). You are considered at risk if:  You are a man who has sex with other men (MSM) and have other risk factors.  You are a heterosexual man, are sexually active, and are at increased risk for HIV infection.  You take drugs by injection.  You are sexually active with a partner who has HIV.  Talk with your health care provider about whether you are at high risk of being infected with HIV. If you choose to begin PrEP, you should first be tested for HIV. You should then be tested every 3 months for as long as you are taking  PrEP.  A one-time screening for abdominal aortic aneurysm (AAA) and surgical repair of large AAAs by ultrasound are recommended for men ages 69 to 23 years who are current or former smokers.  Healthy men should no longer receive prostate-specific antigen (PSA) blood tests as part of routine cancer screening. Talk with your health care provider about prostate cancer screening.  Testicular cancer screening is not recommended for adult males who have no symptoms. Screening includes self-exam, a health care provider exam, and other screening tests. Consult with your health care provider about any symptoms you have or any concerns you have about testicular cancer.  Use sunscreen. Apply sunscreen liberally and repeatedly throughout the day. You should seek shade when your shadow is shorter than you. Protect yourself by wearing long sleeves, pants, a wide-brimmed hat, and sunglasses year round, whenever you are outdoors.  Once a month, do a whole-body skin exam, using a mirror to look at the skin on your back. Tell your health care provider about new moles, moles that have irregular borders, moles that are larger than a pencil eraser, or moles that have changed in shape or color.  Stay current with required vaccines (immunizations).  Influenza vaccine. All adults should be immunized every year.  Tetanus, diphtheria, and acellular pertussis (Td, Tdap) vaccine. An adult who has not previously received Tdap or who does not know his vaccine status should receive 1 dose of Tdap. This initial dose should be followed by tetanus and diphtheria toxoids (Td) booster doses every 10 years. Adults with an unknown or incomplete history of completing a 3-dose immunization series with Td-containing vaccines should begin or complete a primary immunization series including a Tdap dose. Adults should receive a Td booster every 10 years.  Varicella vaccine. An adult without evidence of immunity to varicella should receive 2  doses or a second dose if he has previously received 1 dose.  Human papillomavirus (HPV) vaccine. Males aged 11-21 years who have not received the vaccine previously should receive the 3-dose series. Males aged 22-26 years may be immunized. Immunization is recommended through the age of 41 years for any male who has sex with males and did not get any or all doses earlier. Immunization is recommended for any person with an immunocompromised condition through the age of 33 years if he did not get any or all doses earlier. During the 3-dose series, the second dose should be obtained 4-8  weeks after the first dose. The third dose should be obtained 24 weeks after the first dose and 16 weeks after the second dose.  Zoster vaccine. One dose is recommended for adults aged 40 years or older unless certain conditions are present.  Measles, mumps, and rubella (MMR) vaccine. Adults born before [birth year] generally are considered immune to measles and mumps. Adults born in [birth year] or later should have 1 or more doses of MMR vaccine unless there is a contraindication to the vaccine or there is laboratory evidence of immunity to each of the three diseases. A routine second dose of MMR vaccine should be obtained at least 28 days after the first dose for students attending postsecondary schools, health care workers, or international travelers. People who received inactivated measles vaccine or an unknown type of measles vaccine during 1963-1967 should receive 2 doses of MMR vaccine. People who received inactivated mumps vaccine or an unknown type of mumps vaccine before 1979 and are at high risk for mumps infection should consider immunization with 2 doses of MMR vaccine. Unvaccinated health care workers born before [birth year] who lack laboratory evidence of measles, mumps, or rubella immunity or laboratory confirmation of disease should consider measles and mumps immunization with 2 doses of MMR vaccine or rubella immunization with 1 dose  of MMR vaccine.  Pneumococcal 13-valent conjugate (PCV13) vaccine. When indicated, a person who is uncertain of his immunization history and has no record of immunization should receive the PCV13 vaccine. All adults 99 years of age and older should receive this vaccine. An adult aged 9 years or older who has certain medical conditions and has not been previously immunized should receive 1 dose of PCV13 vaccine. This PCV13 should be followed with a dose of pneumococcal polysaccharide (PPSV23) vaccine. Adults who are at high risk for pneumococcal disease should obtain the PPSV23 vaccine at least 8 weeks after the dose of PCV13 vaccine. Adults older than 57 years of age who have normal immune system function should obtain the PPSV23 vaccine dose at least 1 year after the dose of PCV13 vaccine.  Pneumococcal polysaccharide (PPSV23) vaccine. When PCV13 is also indicated, PCV13 should be obtained first. All adults aged 59 years and older should be immunized. An adult younger than age 84 years who has certain medical conditions should be immunized. Any person who resides in a nursing home or long-term care facility should be immunized. An adult smoker should be immunized. People with an immunocompromised condition and certain other conditions should receive both PCV13 and PPSV23 vaccines. People with human immunodeficiency virus (HIV) infection should be immunized as soon as possible after diagnosis. Immunization during chemotherapy or radiation therapy should be avoided. Routine use of PPSV23 vaccine is not recommended for American Indians, [place]atives, or people younger than 65 years unless there are medical conditions that require PPSV23 vaccine. When indicated, people who have unknown immunization and have no record of immunization should receive PPSV23 vaccine. One-time revaccination 5 years after the first dose of PPSV23 is recommended for people aged 19-64 years who have chronic kidney failure, nephrotic  syndrome, asplenia, or immunocompromised conditions. People who received 1-2 doses of PPSV23 before age 35 years should receive another dose of PPSV23 vaccine at age 100 years or later if at least 5 years have passed since the previous dose. Doses of PPSV23 are not needed for people immunized with PPSV23 at or after age 31 years.  Meningococcal vaccine. Adults with asplenia or persistent complement component deficiencies should receive 2 doses of  quadrivalent meningococcal conjugate (MenACWY-D) vaccine. The doses should be obtained at least 2 months apart. Microbiologists working with certain meningococcal bacteria, [place] recruits, people at risk during an outbreak, and people who travel to or live in countries with a high rate of meningitis should be immunized. A first-year college student up through age 93 years who is living in a residence hall should receive a dose if he did not receive a dose on or after his 16th birthday. Adults who have certain high-risk conditions should receive one or more doses of vaccine.  Hepatitis A vaccine. Adults who wish to be protected from this disease, have chronic liver disease, work with hepatitis A-infected animals, work in hepatitis A research labs, or travel to or work in countries with a high rate of hepatitis A should be immunized. Adults who were previously unvaccinated and who anticipate close contact with an international adoptee during the first 60 days after arrival in the [place] from a country with a high rate of hepatitis A should be immunized.  Hepatitis B vaccine. Adults should be immunized if they wish to be protected from this disease, are under age 43 years and have diabetes, have chronic liver disease, have had more than one sex partner in the past 6 months, may be exposed to blood or other infectious body fluids, are household contacts or sex partners of hepatitis B positive people, are clients or workers in certain care facilities, or travel to  or work in countries with a high rate of hepatitis B.  Haemophilus influenzae type b (Hib) vaccine. A previously unvaccinated person with asplenia or sickle cell disease or having a scheduled splenectomy should receive 1 dose of Hib vaccine. Regardless of previous immunization, a recipient of a hematopoietic stem cell transplant should receive a 3-dose series 6-12 months after his successful transplant. Hib vaccine is not recommended for adults with HIV infection. Preventive Service / Frequency Ages 23 to 55  Blood pressure check.** / Every 3-5 years.  Lipid and cholesterol check.** / Every 5 years beginning at age 46.  Hepatitis C blood test.** / For any individual with known risks for hepatitis C.  Skin self-exam. / Monthly.  Influenza vaccine. / Every year.  Tetanus, diphtheria, and acellular pertussis (Tdap, Td) vaccine.** / Consult your health care provider. 1 dose of Td every 10 years.  Varicella vaccine.** / Consult your health care provider.  HPV vaccine. / 3 doses over 6 months, if 59 or younger.  Measles, mumps, rubella (MMR) vaccine.** / You need at least 1 dose of MMR if you were born in 1957 or later. You may also need a second dose.  Pneumococcal 13-valent conjugate (PCV13) vaccine.** / Consult your health care provider.  Pneumococcal polysaccharide (PPSV23) vaccine.** / 1 to 2 doses if you smoke cigarettes or if you have certain conditions.  Meningococcal vaccine.** / 1 dose if you are age 40 to 37 years and a [employer] researcher living in a residence hall, or have one of several medical conditions. You may also need additional booster doses.  Hepatitis A vaccine.** / Consult your health care provider.  Hepatitis B vaccine.** / Consult your health care provider.  Haemophilus influenzae type b (Hib) vaccine.** / Consult your health care provider. Ages 76 to 1  Blood pressure check.** / Every year.  Lipid and cholesterol check.** / Every 5 years beginning  at age 14.  Lung cancer screening. / Every year if you are aged 17-80 years and have a 30-pack-year history of  smoking and currently smoke or have quit within the past 15 years. Yearly screening is stopped once you have quit smoking for at least 15 years or develop a health problem that would prevent you from having lung cancer treatment.  Fecal occult blood test (FOBT) of stool. / Every year beginning at age 73 and continuing until age 28. You may not have to do this test if you get a colonoscopy every 10 years.  Flexible sigmoidoscopy** or colonoscopy.** / Every 5 years for a flexible sigmoidoscopy or every 10 years for a colonoscopy beginning at age 69 and continuing until age 17.  Hepatitis C blood test.** / For all people born from [birth year] through 1965 and any individual with known risks for hepatitis C.  Skin self-exam. / Monthly.  Influenza vaccine. / Every year.  Tetanus, diphtheria, and acellular pertussis (Tdap/Td) vaccine.** / Consult your health care provider. 1 dose of Td every 10 years.  Varicella vaccine.** / Consult your health care provider.  Zoster vaccine.** / 1 dose for adults aged 43 years or older.  Measles, mumps, rubella (MMR) vaccine.** / You need at least 1 dose of MMR if you were born in 1959 or later. You may also need a second dose.  Pneumococcal 13-valent conjugate (PCV13) vaccine.** / Consult your health care provider.  Pneumococcal polysaccharide (PPSV23) vaccine.** / 1 to 2 doses if you smoke cigarettes or if you have certain conditions.  Meningococcal vaccine.** / Consult your health care provider.  Hepatitis A vaccine.** / Consult your health care provider.  Hepatitis B vaccine.** / Consult your health care provider.  Haemophilus influenzae type b (Hib) vaccine.** / Consult your health care provider. Ages 19 and over  Blood pressure check.** / Every year.  Lipid and cholesterol check.**/ Every 5 years beginning at age 69.  Lung cancer screening.  / Every year if you are aged 62-80 years and have a 30-pack-year history of smoking and currently smoke or have quit within the past 15 years. Yearly screening is stopped once you have quit smoking for at least 15 years or develop a health problem that would prevent you from having lung cancer treatment.  Fecal occult blood test (FOBT) of stool. / Every year beginning at age 42 and continuing until age 52. You may not have to do this test if you get a colonoscopy every 10 years.  Flexible sigmoidoscopy** or colonoscopy.** / Every 5 years for a flexible sigmoidoscopy or every 10 years for a colonoscopy beginning at age 106 and continuing until age 52.  Hepatitis C blood test.** / For all people born from [birth year] through 1967 and any individual with known risks for hepatitis C.  Abdominal aortic aneurysm (AAA) screening.** / A one-time screening for ages 106 to 62 years who are current or former smokers.  Skin self-exam. / Monthly.  Influenza vaccine. / Every year.  Tetanus, diphtheria, and acellular pertussis (Tdap/Td) vaccine.** / 1 dose of Td every 10 years.  Varicella vaccine.** / Consult your health care provider.  Zoster vaccine.** / 1 dose for adults aged 42 years or older.  Pneumococcal 13-valent conjugate (PCV13) vaccine.** / 1 dose for all adults aged 45 years and older.  Pneumococcal polysaccharide (PPSV23) vaccine.** / 1 dose for all adults aged 47 years and older.  Meningococcal vaccine.** / Consult your health care provider.  Hepatitis A vaccine.** / Consult your health care provider.  Hepatitis B vaccine.** / Consult your health care provider.  Haemophilus influenzae type b (Hib) vaccine.** / Consult your  health care provider. **Family history and personal history of risk and conditions may change your health care provider's recommendations.   This information is not intended to replace advice given to you by your health care provider. Make sure you discuss any questions you  have with your health care provider.   Document Released: 10/27/2001 Document Revised: 09/21/2014 Document Reviewed: 01/26/2011 Elsevier Interactive Patient Education [employer].

## 2016-06-23 NOTE — Progress Notes (Signed)
Pre visit review using our clinic tool,if applicable. No additional management support is needed unless otherwise documented below in the visit note.  

## 2016-06-23 NOTE — Progress Notes (Signed)
Subjective:    Patient ID: Todd Jones, male    DOB: 05/19/1961, 55 y.o.   MRN: [id number]  HPI   Pt in for follow up.  Pt states his wife passed just 2 weeks ago. She had rib fractures and then got pneumonia.(Then passed). He handling grieving process fairly well per pt.  Pt still feeling real anxious. He feels panic at times. This in past occurs when he was dealing with his wife cancer. Still feeling anxious and panic at times. Pt does have valium rx. Pt also on sertraline.  Pt is fasting today for wellness exam.  Pt does not exercise in past due to his knee pain. Pt admits poor diet. Decreased appetitite recently since wife passing. Former smoker.  Pt declines flu vaccine.(advised be treated earlier in course of illness if symptoms occur)  He is willing to do colonoscopy.  Will get psa today. No urinary complaints reported.     Review of Systems  Constitutional: Negative for chills, fatigue and fever.  HENT: Negative for congestion, ear discharge, ear pain, hearing loss, nosebleeds, rhinorrhea, sinus pressure, sneezing and sore throat.   Eyes: Negative for pain, itching and visual disturbance.  Respiratory: Negative for apnea, cough, choking, shortness of breath and wheezing.   Cardiovascular: Negative for chest pain and palpitations.  Gastrointestinal: Negative for abdominal pain, blood in stool and constipation.  Endocrine: Negative for polydipsia, polyphagia and polyuria.  Genitourinary: Negative for dysuria, flank pain, frequency, scrotal swelling and testicular pain.  Musculoskeletal: Negative for back pain and gait problem.  Skin: Negative for pallor and rash.  Neurological: Negative for syncope, facial asymmetry, speech difficulty, weakness, numbness and headaches.  Hematological: Negative for adenopathy. Does not bruise/bleed easily.  Psychiatric/Behavioral: Negative for behavioral problems and confusion.     Past Medical History:  Diagnosis Date  .  Arthritis   . GERD (gastroesophageal reflux disease)   . H/O hiatal hernia   . Hypertension   . Insomnia      Social History   Social History  . Marital status: Married    Spouse name: N/A  . Number of children: N/A  . Years of education: N/A   Occupational History  . Not on file.   Social History Main Topics  . Smoking status: Former Games developermoker  . Smokeless tobacco: Never Used  . Alcohol use No  . Drug use:     Types: Marijuana  . Sexual activity: Not on file   Other Topics Concern  . Not on file   Social History Narrative  . No narrative on file    Past Surgical History:  Procedure Laterality Date  . CHOLECYSTECTOMY    . INGUINAL HERNIA REPAIR  10/14/2012   Procedure: LAPAROSCOPIC INGUINAL HERNIA;  Surgeon: Axel FillerArmando Ramirez, MD;  Location: [facility];  Service: General;  Laterality: Right;  . INSERTION OF MESH  10/14/2012   Procedure: INSERTION OF MESH;  Surgeon: Axel FillerArmando Ramirez, MD;  Location: [facility];  Service: General;  Laterality: Right;  . KNEE ARTHROSCOPY     right x 3  . KNEE FUSION      Family History  Problem Relation Age of Onset  . Cancer Maternal Grandfather     lung  . Alcohol abuse Maternal Grandfather     Allergies  Allergen Reactions  . Hydrocodone     Stomach upset  . Toprol Xl [Metoprolol Tartrate]     confusion    Current Outpatient Prescriptions on File Prior to Visit  Medication Sig Dispense  Refill  . diazepam (VALIUM) 5 MG tablet 1 tab p qs as needed insomnia or anxiety 30 tablet 2  . hydrochlorothiazide (HYDRODIURIL) 25 MG tablet Take 1 tablet (25 mg total) by mouth daily. 30 tablet 0  . lisinopril (PRINIVIL,ZESTRIL) 20 MG tablet Take 1 tablet (20 mg total) by mouth daily. 30 tablet 0  . oxyCODONE-acetaminophen (PERCOCET) 7.5-325 MG tablet 1 tab po q 12 hours prn pain 60 tablet 0  . sertraline (ZOLOFT) 50 MG tablet Take 1 tablet (50 mg total) by mouth daily. 30 tablet 3  . potassium chloride SA (K-DUR,KLOR-CON) 20 MEQ tablet Take 1  tablet (20 mEq total) by mouth daily. (Patient not taking: Reported on 06/23/2016) 30 tablet 0   No current facility-administered medications on file prior to visit.     BP 118/80   Pulse 66   Temp 98.1 F (36.7 C) (Oral)   Ht 6\' 1"  (1.854 m)   Wt 201 lb 3.2 oz (91.3 kg)   SpO2 97%   BMI 26.55 kg/m       Objective:   Physical Exam  General Mental Status- Alert. General Appearance- Not in acute distress.   Skin General: Color- Normal Color. Moisture- Normal Moisture. No worrisome lesions on inspection.  Neck Carotid Arteries- Normal color. Moisture- Normal Moisture. No carotid bruits. No JVD.  Chest and Lung Exam Auscultation: Breath Sounds:-Normal. CTA.  Cardiovascular Auscultation:Rythm- Regular. Murmurs & Other Heart Sounds:Auscultation of the heart reveals- No Murmurs.  Abdomen Inspection:-Inspeection Normal. Palpation/Percussion:Note:No mass. Palpation and Percussion of the abdomen reveal- Non Tender, Non Distended + BS, no rebound or guarding.  Neurologic Cranial Nerve exam:- CN III-XII intact(No nystagmus), symmetric smile. Strength:- 5/5 equal and symmetric strength both upper and lower extremities.   Male Genitourinary Urethra:- No discharge. Penis- Circumcised. Scrotum- No masses. Testes- Bilateral-Normal.  Rectal Anorectal Exam: Performed- Normal sphincter tone. No masses noted. Prostate smooth normal size. Stool HEME Negative.    .     Assessment & Plan:  For your wellness exam will get cbc, cmp, tsh, lipid panel, psa,  hiv and ua dip.  You declined flu vaccine today. If you change your mind just let us know.   Will refer you for colonoscopy. Victorino Dike or Silva Bandy are contact persons to call if you don't get call from GI.  Follow up date to be determined after lab review.  Note I asked pt to keep me updated on his mood or anxiety if worsens as he grieves. He states he will.  Kyra Laffey, Ramon Dredge, PA-C

## 2016-06-23 NOTE — Telephone Encounter (Signed)
Patient in office today for follow up visit. States he did get his medications corrected. He lost his wife 2 weekks ago and has been out of sorts since.

## 2016-06-24 LAB — HIV ANTIBODY (ROUTINE TESTING W REFLEX): HIV 1&2 Ab, 4th Generation: NONREACTIVE

## 2016-06-29 MED ORDER — OXYCODONE-ACETAMINOPHEN 7.5-325 MG PO TABS: ORAL_TABLET | ORAL | 0 refills | Status: DC

## 2016-06-29 MED ORDER — DIAZEPAM 5 MG PO TABS: ORAL_TABLET | ORAL | 2 refills | Status: DC

## 2016-06-29 NOTE — Telephone Encounter (Signed)
I printed pt rx of percocet and valium. Please get off printer and have me sign. Then he can pick up the rx.

## 2016-06-30 ENCOUNTER — Telehealth: Payer: Self-pay

## 2016-06-30 MED ORDER — DIAZEPAM 5 MG PO TABS: 5.0000 mg | ORAL_TABLET | Freq: Two times a day (BID) | ORAL | 0 refills | Status: DC | PRN

## 2016-06-30 MED FILL — diazePAM 5 MG TABS: 5 | 30 days supply | Qty: 60 | Fill #0

## 2016-06-30 MED FILL — OXYCODONE-APAP 7.5-325 MG: 7.5-325 | 30 days supply | Qty: 60 | Fill #0

## 2016-06-30 NOTE — Telephone Encounter (Signed)
Rewrote diazepam rx for only one month at bid. Then follow up. Called [facility] and talked with pharmacy.

## 2016-06-30 NOTE — Telephone Encounter (Signed)
Called patient to inform him 2 Rx's will be up front for pick up. Verified he uses [facility] and will pick up today or tomorrow.

## 2016-07-24 ENCOUNTER — Telehealth: Payer: Self-pay | Admitting: Medical

## 2016-07-24 ENCOUNTER — Other Ambulatory Visit: Payer: Self-pay | Admitting: Medical

## 2016-07-24 ENCOUNTER — Encounter: Payer: Self-pay | Admitting: Medical

## 2016-07-24 NOTE — Telephone Encounter (Signed)
Will you send pt letter notifying him that GI office is trying to contact him regarding scheduling his colonoscopy. Will you give pt number of Gi office so he can call them back.

## 2016-07-24 NOTE — Telephone Encounter (Signed)
Letter mailed

## 2016-07-27 ENCOUNTER — Telehealth: Payer: Self-pay | Admitting: *Deleted

## 2016-07-27 NOTE — Telephone Encounter (Signed)
[employer] note received via fax  Call:   Date: 07/25/16 Time: 1346   Caller: Self Return number: [phone] (primary), [phone] (secondary)  Nurse: Fayette PhoAllicon Cox, RN  Chief Complaint: Prescription Refill or Medication Request (non symptomatic)   Reason for call: Caller states his blood pressure meds need refilled. Takes lisinopril and hctz. Uses [facility] [phone]  Related visit to physician within the last 2 weeks: N/A  Guideline: N/A  Disposition: Clinical Call  **3 day supply filled to pharmacy per [facility] standing orders**

## 2016-07-28 ENCOUNTER — Telehealth: Payer: Self-pay | Admitting: Medical

## 2016-07-28 NOTE — Telephone Encounter (Signed)
I saw last night rx refill request that was in my folders. Looks like rx sent on 07-28-2016. Of both lisinopril and hctz. Make sure sent successfully and have pt follow up in one month. Looks like fax came over weekend.

## 2016-07-29 ENCOUNTER — Other Ambulatory Visit: Payer: Self-pay | Admitting: Medical

## 2016-07-29 NOTE — Telephone Encounter (Signed)
Caller name: Relationship to patient: Self Can be reached: [phone]  Pharmacy:  Reason for call: Refills on oxyCODONE-acetaminophen (PERCOCET) 7.5-325 MG tablet [[id number]][[id number]]  diazepam (VALIUM) 5 MG tablet [[id number]][[id number]]   Patient will pick up Rx on Friday morning 11/17

## 2016-07-29 NOTE — Telephone Encounter (Signed)
Please advise 

## 2016-07-30 MED ORDER — OXYCODONE-ACETAMINOPHEN 7.5-325 MG PO TABS: ORAL_TABLET | ORAL | 0 refills | Status: DC

## 2016-07-30 MED ORDER — DIAZEPAM 5 MG PO TABS: 5.0000 mg | ORAL_TABLET | Freq: Two times a day (BID) | ORAL | 0 refills | Status: DC | PRN

## 2016-07-30 NOTE — Telephone Encounter (Signed)
Checking status.

## 2016-07-30 NOTE — Telephone Encounter (Signed)
Pt can pick up his pain med and valium. Please notify him.

## 2016-07-31 MED FILL — OXYCODONE-APAP 7.5-325 MG: 7.5-325 | 30 days supply | Qty: 60 | Fill #0

## 2016-07-31 MED FILL — diazePAM 5 MG TABS: 5 | 30 days supply | Qty: 60 | Fill #0

## 2016-07-31 NOTE — Telephone Encounter (Addendum)
Pt picked up prescriptions this morning.

## 2016-08-26 ENCOUNTER — Telehealth: Payer: Self-pay | Admitting: Medical

## 2016-08-26 NOTE — Telephone Encounter (Signed)
I spoke with the patient and he voices understanding. The patient is was notifeid that [facility] cant fill until 08/29/16 and the patient voices understanding. He states that he will be out of town on Monday and he wanted to pick the Rxs up by the weekend.   Please advise.

## 2016-08-26 NOTE — Telephone Encounter (Signed)
Patient is requesting refills of oxyCODONE-acetaminophen (PERCOCET) 7.5-325 MG tablet  And diazepam (VALIUM) 5 MG tablet    Phone: [phone]

## 2016-08-27 MED ORDER — OXYCODONE-ACETAMINOPHEN 7.5-325 MG PO TABS: ORAL_TABLET | ORAL | 0 refills | Status: DC

## 2016-08-27 NOTE — Telephone Encounter (Signed)
Refilled his percocet.

## 2016-08-27 NOTE — Telephone Encounter (Signed)
16th is Saturday and I believe you stated he was going out of town. He is supposed to fill his med at same pharmacy. So he may need to fill late Friday afternoon since not sure want him to be filling out of town per contract.

## 2016-08-28 ENCOUNTER — Telehealth: Payer: Self-pay | Admitting: Medical

## 2016-08-28 MED FILL — OXYCODONE-APAP 7.5-325 MG: 7.5-325 | 30 days supply | Qty: 60 | Fill #0

## 2016-08-28 NOTE — Telephone Encounter (Signed)
Relation to ZO:XWRUpt:self Call back number:[phone]   Reason for call:  Patient requesting a refill diazepam (VALIUM) 5 MG tablet and oxyCODONE-acetaminophen (PERCOCET) 7.5-325 MG tablet and would like to refill Rx patient is going out of to town for 5 days, please advise

## 2016-08-28 NOTE — Telephone Encounter (Signed)
Notified pt that his medication is ready for pick up.

## 2016-09-04 MED ORDER — DIAZEPAM 5 MG PO TABS: 5.0000 mg | ORAL_TABLET | Freq: Two times a day (BID) | ORAL | 0 refills | Status: DC | PRN

## 2016-09-04 NOTE — Telephone Encounter (Addendum)
Patient checking on the status of diazepam (VALIUM) 5 MG tablet medication refill, please advise patient directly, patient states he's completely out.

## 2016-09-04 NOTE — Addendum Note (Signed)
Addended by: Regis BillSCATES, SHARON L on: 09/04/2016 04:40 PM   Modules accepted: Orders

## 2016-09-04 NOTE — Telephone Encounter (Signed)
Pharmacy contacted and they have not received Rx authorization on Diazepam; gave pharmacy VO via telephone, provider aware. Patient informed, understood & agreed/SLS 12/22

## 2016-09-25 ENCOUNTER — Other Ambulatory Visit: Payer: Self-pay | Admitting: Medical

## 2016-10-01 ENCOUNTER — Ambulatory Visit: Payer: BLUE CROSS/BLUE SHIELD | Admitting: Medical

## 2016-10-02 ENCOUNTER — Other Ambulatory Visit: Payer: Self-pay | Admitting: Medical

## 2016-10-02 NOTE — Telephone Encounter (Signed)
Caller name:[phone] Relation to AV:WUJWJXpt:mother  Call back number:[phone]   Reason for call:  Patient requesting a refill oxyCODONE-acetaminophen (PERCOCET) 7.5-325 MG tablet and diazepam (VALIUM) 5 MG tablet

## 2016-10-05 MED ORDER — DIAZEPAM 5 MG PO TABS: 5.0000 mg | ORAL_TABLET | Freq: Two times a day (BID) | ORAL | 0 refills | Status: DC | PRN

## 2016-10-05 MED ORDER — OXYCODONE-ACETAMINOPHEN 7.5-325 MG PO TABS: ORAL_TABLET | ORAL | 0 refills | Status: DC

## 2016-10-05 NOTE — Telephone Encounter (Signed)
Refill request for Percocet 7.5-325mg  Last filled by MD on - 12/14/017 [fill on or after 08/28/16], #60x0 Last AEX - 10/10/017 Next AEX - 3-mth for lipid check  Refill request for Diazepam 5mg  Last filled by MD on - 09/04/16, #60x0 Last AEX - 09/04/16 Next AEX - 3-mMth for Lipid check  Patient has No CSC and last UDS was 01/16/16 and was Moderate risk as Percocet was bot found in system but Cannabinoid was found. Please Advise on refills/SLS 01/22

## 2016-10-05 NOTE — Telephone Encounter (Signed)
Will you let pt know he can pick up his rx of percocet and valium.

## 2016-10-05 NOTE — Telephone Encounter (Signed)
Patient calling back checking on the status of medication refill, inform patient PCP will follow up when Rx is ready for pick up  [facility] # [phone]

## 2016-10-05 NOTE — Telephone Encounter (Signed)
Patient informed, understood & agreed/SLS 01/22

## 2016-10-06 MED FILL — OXYCODONE-APAP 7.5-325 MG: 7.5-325 | 30 days supply | Qty: 60 | Fill #0

## 2016-10-06 MED FILL — diazePAM 5 MG TABS: 5 | 30 days supply | Qty: 60 | Fill #0

## 2016-10-08 ENCOUNTER — Telehealth: Payer: Self-pay | Admitting: Medical

## 2016-10-08 ENCOUNTER — Ambulatory Visit: Payer: BLUE CROSS/BLUE SHIELD | Admitting: Medical

## 2016-10-08 NOTE — Telephone Encounter (Signed)
No charge. 

## 2016-10-08 NOTE — Telephone Encounter (Signed)
Charge if he has no showed before. If first time no charge

## 2016-10-08 NOTE — Telephone Encounter (Signed)
Patient lvm cancelling 9:15am appointment today due to work conflict, [facility], charge or no charge

## 2016-10-14 ENCOUNTER — Ambulatory Visit: Payer: BLUE CROSS/BLUE SHIELD | Admitting: Medical

## 2016-10-15 ENCOUNTER — Encounter: Payer: Self-pay | Admitting: Medical

## 2016-10-15 ENCOUNTER — Ambulatory Visit (INDEPENDENT_AMBULATORY_CARE_PROVIDER_SITE_OTHER): Payer: BLUE CROSS/BLUE SHIELD | Admitting: Medical

## 2016-10-15 VITALS — BP 150/95 | HR 76 | Temp 97.9°F | Ht 73.0 in | Wt 197.4 lb

## 2016-10-15 DIAGNOSIS — M25561 Pain in right knee: Secondary | ICD-10-CM | POA: Diagnosis not present

## 2016-10-15 DIAGNOSIS — I10 Essential (primary) hypertension: Secondary | ICD-10-CM

## 2016-10-15 DIAGNOSIS — F411 Generalized anxiety disorder: Secondary | ICD-10-CM | POA: Diagnosis not present

## 2016-10-15 LAB — COMPREHENSIVE METABOLIC PANEL
ALT: 20 U/L (ref 0–53)
AST: 30 U/L (ref 0–37)
Albumin: 4.4 g/dL (ref 3.5–5.2)
Alkaline Phosphatase: 54 U/L (ref 39–117)
BILIRUBIN TOTAL: 0.4 mg/dL (ref 0.2–1.2)
BUN: 9 mg/dL (ref 6–23)
CO2: 31 meq/L (ref 19–32)
Calcium: 9.3 mg/dL (ref 8.4–10.5)
Chloride: 102 mEq/L (ref 96–112)
Creatinine, Ser: 0.9 mg/dL (ref 0.40–1.50)
GFR: 92.37 mL/min (ref >60.00)
Glucose, Bld: 113 mg/dL — ABNORMAL HIGH (ref 70–99)
Potassium: 3.5 mEq/L (ref 3.5–5.1)
SODIUM: 136 meq/L (ref 135–145)
Total Protein: 6.8 g/dL (ref 6.0–8.3)

## 2016-10-15 MED ORDER — AMLODIPINE BESYLATE 5 MG PO TABS: 5.0000 mg | ORAL_TABLET | Freq: Every day | ORAL | 0 refills | Status: DC

## 2016-10-15 NOTE — Progress Notes (Signed)
Subjective:    Patient ID: Todd Jones, male    DOB: 05/02/1961, 56 y.o.   MRN: [id number]  HPI   Pt in for in follow up.  Pt in for follow up. Pt has knee pain on rt side. Pt has severe degenerative changes. Pt wants to delay surgery. Pt has a lot of financial obligations related to wifes illness. He can't take off work due to these bills.   Pt also has some low back pain but knee is the worse pain.  Pt about to change to change jobs and may have lapse in insurance.  Pt also has some anxiety in past. Pt is on valium as well.   Has signed controlled med contract.  Pt blood pressure med. He is on both zestril and hctz. He states when he takes at night has less effect. No neuro or cardiac signs or symptoms.    Review of Systems  Constitutional: Negative for chills and fatigue.  Respiratory: Negative for chest tightness, shortness of breath and wheezing.   Cardiovascular: Negative for chest pain and palpitations.  Gastrointestinal: Negative for abdominal pain.  Genitourinary: Negative for dysuria and flank pain.  Musculoskeletal: Negative for back pain.       Knee pain.  Skin: Negative for rash.  Neurological: Negative for dizziness, syncope, facial asymmetry, speech difficulty, weakness and headaches.  Hematological: Negative for adenopathy. Does not bruise/bleed easily.  Psychiatric/Behavioral: Negative for behavioral problems and confusion. The patient is nervous/anxious.        Anxiety history. But doing well with medications.    Past Medical History:  Diagnosis Date  . Arthritis   . GERD (gastroesophageal reflux disease)   . H/O hiatal hernia   . Hypertension   . Insomnia      Social History   Social History  . Marital status: Married    Spouse name: N/A  . Number of children: N/A  . Years of education: N/A   Occupational History  . Not on file.   Social History Main Topics  . Smoking status: Former Games developermoker  . Smokeless tobacco: Never Used  .  Alcohol use No  . Drug use: Yes    Types: Marijuana  . Sexual activity: Not on file   Other Topics Concern  . Not on file   Social History Narrative  . No narrative on file    Past Surgical History:  Procedure Laterality Date  . CHOLECYSTECTOMY    . INGUINAL HERNIA REPAIR  10/14/2012   Procedure: LAPAROSCOPIC INGUINAL HERNIA;  Surgeon: Axel FillerArmando Ramirez, MD;  Location: [facility];  Service: General;  Laterality: Right;  . INSERTION OF MESH  10/14/2012   Procedure: INSERTION OF MESH;  Surgeon: Axel FillerArmando Ramirez, MD;  Location: [facility];  Service: General;  Laterality: Right;  . KNEE ARTHROSCOPY     right x 3  . KNEE FUSION      Family History  Problem Relation Age of Onset  . Cancer Maternal Grandfather     lung  . Alcohol abuse Maternal Grandfather     Allergies  Allergen Reactions  . Hydrocodone     Stomach upset  . Toprol Xl [Metoprolol Tartrate]     confusion    Current Outpatient Prescriptions on File Prior to Visit  Medication Sig Dispense Refill  . diazepam (VALIUM) 5 MG tablet Take 1 tablet (5 mg total) by mouth every 12 (twelve) hours as needed for anxiety. 60 tablet 0  . hydrochlorothiazide (HYDRODIURIL) 25 MG tablet TAKE 1  TABLET BY MOUTH DAILY 30 tablet 0  . lisinopril (PRINIVIL,ZESTRIL) 20 MG tablet TAKE 1 TABLET BY MOUTH DAILY 30 tablet 0  . oxyCODONE-acetaminophen (PERCOCET) 7.5-325 MG tablet 1 tab po q 12 hours prn pain 60 tablet 0  . sertraline (ZOLOFT) 50 MG tablet Take 1 tablet (50 mg total) by mouth daily. 30 tablet 3  . potassium chloride SA (K-DUR,KLOR-CON) 20 MEQ tablet Take 1 tablet (20 mEq total) by mouth daily. (Patient not taking: Reported on 10/15/2016) 30 tablet 0   No current facility-administered medications on file prior to visit.     BP (!) 146/97   Pulse 76   Temp 97.9 F (36.6 C) (Oral)   Ht 6\' 1"  (1.854 m)   Wt 197 lb 6.4 oz (89.5 kg)   SpO2 98%   BMI 26.04 kg/m       Objective:   Physical Exam   General Mental Status- Alert.  General Appearance- Not in acute distress.   Skin General: Color- Normal Color. Moisture- Normal Moisture.  Neck Carotid Arteries- Normal color. Moisture- Normal Moisture. No carotid bruits. No JVD.  Chest and Lung Exam Auscultation: Breath Sounds:-Normal.  Cardiovascular Auscultation:Rythm- Regular. Murmurs & Other Heart Sounds:Auscultation of the heart reveals- No Murmurs.  Abdomen Inspection:-Inspeection Normal. Palpation/Percussion:Note:No mass. Palpation and Percussion of the abdomen reveal- Non Tender, Non Distended + BS, no rebound or guarding.    Neurologic Cranial Nerve exam:- CN III-XII intact(No nystagmus), symmetric smile. Strength:- 5/5 equal and symmetric strength both upper and lower extremities.  Rt knee- crepitus on ROM.     Assessment & Plan:  For your high blood pressure I want you to get new bp machine and check bp daily. If bp reading consistent over 140/90 then add amlodipine. Get cmp today.  For your knee pain continue percocet.  For you anxiety continue valium.   Can pick up rx of pain medication next month.  Follow up in 3 months or as needed   Note plan to get UDS before refill of narcotic.  Lauryl Seyer, Todd Dredge, PA-C

## 2016-10-15 NOTE — Progress Notes (Signed)
Pre visit review using our clinic tool,if applicable. No additional management support is needed unless otherwise documented below in the visit note.  

## 2016-10-15 NOTE — Patient Instructions (Addendum)
For your high blood pressure I want you to get new bp machine and check bp daily. If bp reading consistent over 140/90 then add amlodipine. Get cmp today.  For your knee pain continue percocet.  For you anxiety continue valium.   Can pick up rx of pain medication next month.  Follow up in 3 months or as needed

## 2016-10-21 ENCOUNTER — Telehealth: Payer: Self-pay | Admitting: Medical

## 2016-10-21 NOTE — Telephone Encounter (Signed)
I advised him he needs to give a UDS. Not drop of urine. He needs to go to lab explain here for uds. They will give him a cup and he gives sample. He needs to do this before his next refill of meds. He can't just drop of urine. Apparently he misunderstood what I said.

## 2016-10-21 NOTE — Telephone Encounter (Signed)
Patient was seen in office last Thursday, 10/15/16.

## 2016-10-21 NOTE — Telephone Encounter (Signed)
°  Relation to ZO:XWRUpt:self Call back number:[phone]   Reason for call:  Patient stated PCP advised to come in and drop off urine, chart doesn't reflect orders, patient would like to come in tomorrow 10/22/16, please advise

## 2016-10-22 ENCOUNTER — Other Ambulatory Visit: Payer: BLUE CROSS/BLUE SHIELD

## 2016-10-22 NOTE — Telephone Encounter (Signed)
Patient informed. 

## 2016-10-23 ENCOUNTER — Encounter: Payer: Self-pay | Admitting: Medical

## 2016-10-29 ENCOUNTER — Other Ambulatory Visit: Payer: Self-pay | Admitting: Medical

## 2016-11-04 ENCOUNTER — Telehealth: Payer: Self-pay | Admitting: Medical

## 2016-11-04 NOTE — Telephone Encounter (Signed)
Refill request for Oxycodone-APAP 7.5-325mg  Last filled by MD on - 10/05/16, #60x0 Refill request for Diazepam 5mg  Last filled by MD on - 10/05/16, #60 Last AEX - 10/15/16 Next AEX - 3-Mths Please Advise on refills/SLS

## 2016-11-04 NOTE — Telephone Encounter (Signed)
Pt was made aware at last visit that he must give a UDS before any further refills. I don't need to see him. But he needs to give sample in our office. Otherwise he is aware I can't give him further meds. We discussed this in past. So remind him. I will give him script only after urine submitted.

## 2016-11-04 NOTE — Telephone Encounter (Signed)
Relation to ZO:XWRUpt:self Call back [phone]   Reason for call:  Patient requesting a refill oxyCODONE-acetaminophen (PERCOCET) 7.5-325 MG tablet and diazepam (VALIUM) 5 MG tablet

## 2016-11-05 MED ORDER — DIAZEPAM 5 MG PO TABS: 5.0000 mg | ORAL_TABLET | Freq: Two times a day (BID) | ORAL | 0 refills | Status: DC | PRN

## 2016-11-05 MED ORDER — OXYCODONE-ACETAMINOPHEN 7.5-325 MG PO TABS: ORAL_TABLET | ORAL | 0 refills | Status: DC

## 2016-11-05 NOTE — Telephone Encounter (Signed)
Pt can pick up his oxycodone and valium rx. Reviwed drug screen today and only showed positive for meds which he is taking.

## 2016-11-05 NOTE — Telephone Encounter (Signed)
Called the patient informed hardcopy of both (printed on same sheet of script paper) is ready for pickup at the front desk.

## 2016-11-05 NOTE — Telephone Encounter (Signed)
Patient informed. Notified lab that patient states he submitted Urine for DS 2 weeks ago. They are going to try to track it down.

## 2016-11-09 MED FILL — OXYCODONE-APAP 7.5-325 MG: 7.5-325 | 30 days supply | Qty: 60 | Fill #0

## 2016-11-09 MED FILL — diazePAM 5 MG TABS: 5 | 30 days supply | Qty: 60 | Fill #0

## 2016-11-23 ENCOUNTER — Telehealth: Payer: Self-pay | Admitting: Medical

## 2016-11-23 NOTE — Telephone Encounter (Signed)
uds came back positive for valium and percocet both which he is supposed to be on. Current low-moderate risk. Hx of marijuana use but none detected on uds most recent.

## 2016-12-03 ENCOUNTER — Telehealth: Payer: Self-pay | Admitting: Medical

## 2016-12-03 ENCOUNTER — Other Ambulatory Visit: Payer: Self-pay | Admitting: Emergency Medicine

## 2016-12-03 MED ORDER — DIAZEPAM 5 MG PO TABS: 5.0000 mg | ORAL_TABLET | Freq: Two times a day (BID) | ORAL | 0 refills | Status: DC | PRN

## 2016-12-03 MED ORDER — OXYCODONE-ACETAMINOPHEN 7.5-325 MG PO TABS: ORAL_TABLET | ORAL | 0 refills | Status: DC

## 2016-12-03 NOTE — Telephone Encounter (Signed)
Pt due for refill of valium and percocet. Signed rx. Placed on your desk. He can pick up rx.

## 2016-12-03 NOTE — Telephone Encounter (Signed)
Thanks Ramon Dredgedward!

## 2016-12-03 NOTE — Telephone Encounter (Signed)
Requesting: Diazepam (VALIUM) 5 MG tablet oxyCODONE-ACETAMINOPHEN (PERCOCET) 7.5-325 MG tablet Last OV: 10/15/2016 Last Refill: 11/05/16  Please Advise

## 2016-12-03 NOTE — Telephone Encounter (Signed)
Caller name: Relationship to patient: Self Can be reached: [phone]  Pharmacy:  Reason for call: Refill on oxyCODONE-acetaminophen (PERCOCET) 7.5-325 MG tablet [[id number]  diazepam (VALIUM) 5 MG tablet [[id number]][[id number]]

## 2016-12-07 MED FILL — diazePAM 5 MG TABS: 5 | 30 days supply | Qty: 60 | Fill #0

## 2016-12-07 MED FILL — OXYCODON-ACETAMINOPHEN 7.5-: 7.5-325 | 30 days supply | Qty: 60 | Fill #0

## 2016-12-07 NOTE — Telephone Encounter (Signed)
This has been completed without documentation in note; pt p/u and signed for controlled medication on 12/04/16.SLS 03/26

## 2016-12-31 ENCOUNTER — Other Ambulatory Visit: Payer: Self-pay

## 2016-12-31 MED ORDER — HYDROCHLOROTHIAZIDE 25 MG PO TABS: 25.0000 mg | ORAL_TABLET | Freq: Every day | ORAL | 1 refills | Status: DC

## 2017-01-04 ENCOUNTER — Telehealth: Payer: Self-pay | Admitting: Medical

## 2017-01-04 MED ORDER — OXYCODONE-ACETAMINOPHEN 7.5-325 MG PO TABS: ORAL_TABLET | ORAL | 0 refills | Status: DC

## 2017-01-04 NOTE — Telephone Encounter (Signed)
Refill request: Oxycodone  Last RX:12/03/16 Last OV:10/15/16 Next QI:ONGE UDS:10/22/16 CSC:01/16/16

## 2017-01-04 NOTE — Telephone Encounter (Signed)
Caller name:Param Popwell Relationship to patient: Can be reached:[phone] Pharmacy:  Reason for call:Requesting refill on percocet 7.5-325mg  and diazepam 

## 2017-01-04 NOTE — Telephone Encounter (Signed)
He did give drug screen in feb 2018. It is in media under lab result scan. That was just 2 months ago so he would not need uds at present time. But he could go ahead and sign new contract this month or next month. I signed the rx and he can pick up tomorrow.

## 2017-01-05 NOTE — Telephone Encounter (Signed)
Notified pt Rx is ready for pick up at  [facility].

## 2017-01-06 ENCOUNTER — Telehealth: Payer: Self-pay | Admitting: Medical

## 2017-01-06 MED FILL — OXYCODON-ACETAMINOPHEN 7.5-: 7.5-325 | 30 days supply | Qty: 60 | Fill #0

## 2017-01-06 NOTE — Telephone Encounter (Signed)
error 

## 2017-01-07 ENCOUNTER — Other Ambulatory Visit: Payer: Self-pay

## 2017-01-07 MED ORDER — DIAZEPAM 5 MG PO TABS: 5.0000 mg | ORAL_TABLET | Freq: Two times a day (BID) | ORAL | 0 refills | Status: DC | PRN

## 2017-01-07 NOTE — Telephone Encounter (Signed)
Rx faxed to pharmacy  

## 2017-01-07 NOTE — Telephone Encounter (Signed)
Refill Request: Diazepam  Last RX:12/03/16 Last OV:10/15/16 Next ZO:XWRU UDS:10/22/16 CSC:01/15/17

## 2017-01-07 NOTE — Telephone Encounter (Signed)
I did print the valium rx.Marland Kitchen You can fax over the rx to his pharmacy.

## 2017-02-01 ENCOUNTER — Encounter: Payer: Self-pay | Admitting: Medical

## 2017-02-01 ENCOUNTER — Ambulatory Visit (INDEPENDENT_AMBULATORY_CARE_PROVIDER_SITE_OTHER): Payer: BLUE CROSS/BLUE SHIELD | Admitting: Medical

## 2017-02-01 VITALS — BP 179/95 | HR 66 | Temp 98.2°F | Resp 16 | Ht 75.0 in | Wt 193.2 lb

## 2017-02-01 DIAGNOSIS — I10 Essential (primary) hypertension: Secondary | ICD-10-CM | POA: Diagnosis not present

## 2017-02-01 DIAGNOSIS — E785 Hyperlipidemia, unspecified: Secondary | ICD-10-CM | POA: Diagnosis not present

## 2017-02-01 DIAGNOSIS — M25562 Pain in left knee: Secondary | ICD-10-CM

## 2017-02-01 DIAGNOSIS — R739 Hyperglycemia, unspecified: Secondary | ICD-10-CM

## 2017-02-01 DIAGNOSIS — F411 Generalized anxiety disorder: Secondary | ICD-10-CM | POA: Diagnosis not present

## 2017-02-01 DIAGNOSIS — M25561 Pain in right knee: Secondary | ICD-10-CM

## 2017-02-01 DIAGNOSIS — G8929 Other chronic pain: Secondary | ICD-10-CM

## 2017-02-01 LAB — LIPID PANEL
CHOL/HDL RATIO: 4
CHOLESTEROL: 139 mg/dL (ref 0–200)
HDL: 37.5 mg/dL — ABNORMAL LOW (ref >39.00)
LDL CALC: 85 mg/dL (ref 0–99)
NONHDL: 101.19
Triglycerides: 82 mg/dL (ref 0.0–149.0)
VLDL: 16.4 mg/dL (ref 0.0–40.0)

## 2017-02-01 LAB — COMPREHENSIVE METABOLIC PANEL
ALBUMIN: 4.3 g/dL (ref 3.5–5.2)
ALT: 19 U/L (ref 0–53)
AST: 16 U/L (ref 0–37)
Alkaline Phosphatase: 49 U/L (ref 39–117)
BUN: 11 mg/dL (ref 6–23)
CHLORIDE: 102 meq/L (ref 96–112)
CO2: 29 meq/L (ref 19–32)
CREATININE: 0.8 mg/dL (ref 0.40–1.50)
Calcium: 9.3 mg/dL (ref 8.4–10.5)
GFR: 105.7 mL/min (ref >60.00)
GLUCOSE: 126 mg/dL — AB (ref 70–99)
POTASSIUM: 4.9 meq/L (ref 3.5–5.1)
Sodium: 136 mEq/L (ref 135–145)
Total Bilirubin: 0.4 mg/dL (ref 0.2–1.2)
Total Protein: 6.5 g/dL (ref 6.0–8.3)

## 2017-02-01 LAB — HEMOGLOBIN A1C: HEMOGLOBIN A1C: 5.1 % (ref 4.6–6.5)

## 2017-02-01 MED ORDER — OXYCODONE-ACETAMINOPHEN 7.5-325 MG PO TABS: ORAL_TABLET | ORAL | 0 refills | Status: DC

## 2017-02-01 MED ORDER — AMLODIPINE BESYLATE 10 MG PO TABS: 10.0000 mg | ORAL_TABLET | Freq: Every day | ORAL | 2 refills | Status: DC

## 2017-02-01 MED ORDER — SERTRALINE HCL 100 MG PO TABS: 100.0000 mg | ORAL_TABLET | Freq: Every day | ORAL | 3 refills | Status: DC

## 2017-02-01 NOTE — Progress Notes (Signed)
Subjective:    Patient ID: Todd Jones, male    DOB: [birth year], 56 y.o.   MRN: [id number]  HPI   Pt in for follow up.  Pt Bp when my nurse checked was 184/100. When he checks his blood pressure at [facility] will get 180/70-80. He only checks at [facility] 1 day a week. Pt has old cuff about 58 years old that he got from his mom. He keeps getting error readings. No neurologic or cardiac signs or symptoms.  Pt has not taken the amlodipine as I advised.  Pt has high cholesterol. He is fasting presently. Also  mild high sugars in past but a1c in past 7 months ago.  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 Pt told in past he woud benefit from knee replacement but he can't take off time from work due to a lot bills he has to pay.   Pt anxiety still present. His panic attacks are controlled. He states valium helps. He is on sertraline 50 mg a day.     Review of Systems  Constitutional: Negative for chills, diaphoresis, fatigue and unexpected weight change.  HENT: Negative for congestion, drooling, ear pain, facial swelling, hearing loss, rhinorrhea, sinus pain and sinus pressure.   Respiratory: Negative for cough, chest tightness, shortness of breath and wheezing.   Cardiovascular: Negative for chest pain and palpitations.  Gastrointestinal: Negative for abdominal pain, constipation, nausea and vomiting.  Genitourinary: Negative for dysuria and frequency.  Musculoskeletal: Negative for back pain, gait problem, myalgias and neck stiffness.       Bilateral daily severe  Knee pain.  Skin: Negative for rash.  Neurological: Negative for dizziness, syncope, speech difficulty, weakness and numbness.  Hematological: Negative for adenopathy. Does not bruise/bleed easily.  Psychiatric/Behavioral: Negative for behavioral problems, confusion and suicidal ideas. The patient is nervous/anxious.    Past Medical History:  Diagnosis Date  . Arthritis   . GERD (gastroesophageal reflux disease)   .  H/O hiatal hernia   . Hypertension   . Insomnia      Social History   Social History  . Marital status: Married    Spouse name: N/A  . Number of children: N/A  . Years of education: N/A   Occupational History  . Not on file.   Social History Main Topics  . Smoking status: Former Games developer  . Smokeless tobacco: Never Used  . Alcohol use No  . Drug use: Yes    Types: Marijuana  . Sexual activity: Not on file   Other Topics Concern  . Not on file   Social History Narrative  . No narrative on file    Past Surgical History:  Procedure Laterality Date  . CHOLECYSTECTOMY    . INGUINAL HERNIA REPAIR  10/14/2012   Procedure: LAPAROSCOPIC INGUINAL HERNIA;  Surgeon: Axel Filler, MD;  Location: [facility];  Service: General;  Laterality: Right;  . INSERTION OF MESH  10/14/2012   Procedure: INSERTION OF MESH;  Surgeon: Axel Filler, MD;  Location: [facility];  Service: General;  Laterality: Right;  . KNEE ARTHROSCOPY     right x 3  . KNEE FUSION      Family History  Problem Relation Age of Onset  . Cancer Maternal Grandfather        lung  . Alcohol abuse Maternal Grandfather     Allergies  Allergen Reactions  . Hydrocodone     Stomach upset  . Toprol Xl [Metoprolol Tartrate]     confusion  Current Outpatient Prescriptions on File Prior to Visit  Medication Sig Dispense Refill  . amLODipine (NORVASC) 5 MG tablet Take 1 tablet (5 mg total) by mouth daily. 90 tablet 0  . diazepam (VALIUM) 5 MG tablet Take 1 tablet (5 mg total) by mouth every 12 (twelve) hours as needed for anxiety. 60 tablet 0  . hydrochlorothiazide (HYDRODIURIL) 25 MG tablet Take 1 tablet (25 mg total) by mouth daily. 30 tablet 1  . lisinopril (PRINIVIL,ZESTRIL) 20 MG tablet TAKE 1 TABLET BY MOUTH DAILY 30 tablet 2  . oxyCODONE-acetaminophen (PERCOCET) 7.5-325 MG tablet 1 tab po q 12 hours prn pain 60 tablet 0  . potassium chloride SA (K-DUR,KLOR-CON) 20 MEQ tablet Take 1 tablet (20 mEq total) by mouth  daily. 30 tablet 0  . sertraline (ZOLOFT) 50 MG tablet Take 1 tablet (50 mg total) by mouth daily. 30 tablet 3   No current facility-administered medications on file prior to visit.     BP (!) 184/100 (BP Location: Left Arm, Patient Position: Sitting, Cuff Size: Normal)   Pulse 66   Temp 98.2 F (36.8 C) (Oral)   Resp 16   Ht 6\' 3"  (1.905 m)   Wt 193 lb 3.2 oz (87.6 kg)   SpO2 98%   BMI 24.15 kg/m       Objective:   Physical Exam  General Mental Status- Alert. General Appearance- Not in acute distress.   Skin General: Color- Normal Color. Moisture- Normal Moisture.  Neck Carotid Arteries- Normal color. Moisture- Normal Moisture. No carotid bruits. No JVD.  Chest and Lung Exam Auscultation: Breath Sounds:-Normal.  Cardiovascular Auscultation:Rythm- Regular. Murmurs & Other Heart Sounds:Auscultation of the heart reveals- No Murmurs.  Abdomen Inspection:-Inspeection Normal. Palpation/Percussion:Note:No mass. Palpation and Percussion of the abdomen reveal- Non Tender, Non Distended + BS, no rebound or guarding.    Neurologic Cranial Nerve exam:- CN III-XII intact(No nystagmus), symmetric smile. Drift Test:- No drift. Romberg Exam:- Negative.  Heal to Toe Gait exam:-Normal. Finger to Nose:- Normal/Intact Strength:- 5/5 equal and symmetric strength both upper and lower extremities.  Both knees- crepitus on flexion and extension.       Assessment & Plan:  For your blood pressure will add amlodipine 10 mg a day(it is apparent you do need the amlodipine I recommended).  For anxiety rx sertraline 100 mg a day. Continue valium  For high sugars in past recheck a1c today.  For high cholesterol will repeat lipid panel today. Will check cmp as well.  For knee pain will refill your pain med but post date refill until 02-03-2017.  Follow up in 2 weeks nurse bp check or as needed  Khadir Roam, Ramon DredgeEdward, [place] JerseyPA-C

## 2017-02-01 NOTE — Patient Instructions (Addendum)
For your blood pressure will add amlodipine 10 mg a day(it is apparent you do need the amlodipine I recommended).  For anxiety rx sertraline 100 mg a day. Continue valium  For high sugars in past recheck a1c today.  For high cholesterol will repeat lipid panel today. Will check cmp as well.  For knee pain will refill your pain med but post date refill until 02-03-2017.  Follow up in 2 weeks nurse bp check or as needed  If with high blood pressure and cardiac or neurologic sign or symptoms then ED evaluation.

## 2017-02-03 MED FILL — OXYCODON-ACETAMINOPHEN 7.5-: 7.5-325 | 30 days supply | Qty: 60 | Fill #0

## 2017-02-06 ENCOUNTER — Other Ambulatory Visit: Payer: Self-pay | Admitting: Medical

## 2017-02-10 ENCOUNTER — Telehealth: Payer: Self-pay | Admitting: Medical

## 2017-02-10 NOTE — Telephone Encounter (Signed)
Relation to ZO:XWRUpt:self Call back number: [phone]   Reason for call:  Patient states PCP prescribed amLODipine (NORVASC) 10 MG tablet unsure if he should take lisinopril (PRINIVIL,ZESTRIL) 20 MG tablet as well,please advise

## 2017-02-11 ENCOUNTER — Telehealth: Payer: Self-pay | Admitting: Medical

## 2017-02-11 NOTE — Telephone Encounter (Signed)
  Yes I do want him to add the amlodipine and he can continue lisinopril. I did add amlodipine based on this portion of my last note.  "Pt Bp when my nurse checked was 184/100. [facility]. Pt has old cuff about 56 years old that he got from his mom. He keeps getting error readings. No neurologic or cardiac signs or symptoms."  Would you ask him to take both and schedule nurse bp check in 2 weeks to confirm bp well controlled.

## 2017-02-12 NOTE — Telephone Encounter (Signed)
Relation to WU:JWJXpt:self Call back number:[phone] Pharmacy: [facility] - [place] - [address] [address] [phone] (Phone) [phone]     Reason for call:  Patient checking on the status of lisinopril (PRINIVIL,ZESTRIL) 20 MG tablet refill, request sent by pharmacy on 02/06/17,please advise

## 2017-02-12 NOTE — Telephone Encounter (Signed)
Notified pt of message below. Pt voiced understanding. Pt states he will call back next week to schedule Nurse visit BP check.

## 2017-02-15 MED ORDER — LISINOPRIL 20 MG PO TABS: 20.0000 mg | ORAL_TABLET | Freq: Every day | ORAL | 2 refills | Status: DC

## 2017-02-15 NOTE — Telephone Encounter (Signed)
Sent rx to pharmacy

## 2017-02-15 NOTE — Addendum Note (Signed)
Addended by: Orlene OchRENCE, Cortana Vanderford N on: 02/15/2017 10:47 AM   Modules accepted: Orders

## 2017-02-15 NOTE — Telephone Encounter (Signed)
Caller name: Ethelda ChickRonald Jones Relationship to patient: self Can be reached:[phone] Pharmacy: [facility] - [place], [place] - [address]  Reason for call: pt was advised to take the lisinopril but he is out. He hasn't had any for over a week. Please call in today as he was advised to continue.

## 2017-03-03 ENCOUNTER — Other Ambulatory Visit: Payer: Self-pay | Admitting: Medical

## 2017-03-03 MED ORDER — DIAZEPAM 5 MG PO TABS: 5.0000 mg | ORAL_TABLET | Freq: Two times a day (BID) | ORAL | 0 refills | Status: DC | PRN

## 2017-03-03 MED ORDER — OXYCODONE-ACETAMINOPHEN 7.5-325 MG PO TABS: ORAL_TABLET | ORAL | 0 refills | Status: DC

## 2017-03-03 MED ORDER — HYDROCHLOROTHIAZIDE 25 MG PO TABS: 25.0000 mg | ORAL_TABLET | Freq: Every day | ORAL | 1 refills | Status: DC

## 2017-03-03 NOTE — Telephone Encounter (Signed)
Refill Request: Percocet   Last RX:02/01/17 Last OV:02/01/17 Next WU:JWJXOV:None scheduled  UDS:10/22/16 CSC:01/05/17   Refill Request: Diazapam  Last RX:01/07/17 Last OV:02/01/17  Next BJ:YNWGOV:None scheduled  UDS:10/22/16 CSC:01/05/17

## 2017-03-03 NOTE — Telephone Encounter (Signed)
oxyCODONE-acetaminophen (PERCOCET) 7.5-325 MG tablet  And diazepam (VALIUM) 5 MG tablet refilled. Patient called for refill has 2 left of each

## 2017-03-03 NOTE — Telephone Encounter (Signed)
Notified pt rx is ready for pick up  

## 2017-03-05 MED FILL — OXYCODON-ACETAMINOPHEN 7.5-: 7.5-325 | 30 days supply | Qty: 60 | Fill #0

## 2017-03-05 MED FILL — diazePAM 5 MG TABS: 5 | 30 days supply | Qty: 60 | Fill #0

## 2017-04-01 ENCOUNTER — Telehealth: Payer: Self-pay | Admitting: Medical

## 2017-04-01 MED ORDER — DIAZEPAM 5 MG PO TABS: 5.0000 mg | ORAL_TABLET | Freq: Two times a day (BID) | ORAL | 0 refills | Status: DC | PRN

## 2017-04-01 MED ORDER — OXYCODONE-ACETAMINOPHEN 7.5-325 MG PO TABS: ORAL_TABLET | ORAL | 0 refills | Status: DC

## 2017-04-01 NOTE — Telephone Encounter (Signed)
Relation to UE:AVWUpt:self Call back [phone]   Reason for call:  Patient requesting a refill oxyCODONE-acetaminophen (PERCOCET) 7.5-325 MG tablet and diazepam (VALIUM) 5 MG tablet

## 2017-04-01 NOTE — Telephone Encounter (Signed)
I signed his controlled meds today. Left it at your desk. Did you notify pt he can pick up scripts.

## 2017-04-01 NOTE — Telephone Encounter (Signed)
Refill Request Diazepam & Percocet   Last RX:03/03/17 Last OV:02/05/17 Next NW:GNFAOV:none UDS:03/05/17 CSC:01/05/17

## 2017-04-02 MED FILL — diazePAM 5 MG TABS: 5 | 30 days supply | Qty: 60 | Fill #0

## 2017-04-02 NOTE — Telephone Encounter (Signed)
Pt.notified

## 2017-05-03 ENCOUNTER — Other Ambulatory Visit: Payer: Self-pay | Admitting: Medical

## 2017-05-03 NOTE — Telephone Encounter (Signed)
Self  Refill request for PERCOCET,VALIUM    Pt says that he is aware that he may be a few day to soon on requesting refill.

## 2017-05-05 MED ORDER — OXYCODONE-ACETAMINOPHEN 7.5-325 MG PO TABS: ORAL_TABLET | ORAL | 0 refills | Status: DC

## 2017-05-05 MED ORDER — DIAZEPAM 5 MG PO TABS: 5.0000 mg | ORAL_TABLET | Freq: Two times a day (BID) | ORAL | 0 refills | Status: DC | PRN

## 2017-05-05 NOTE — Telephone Encounter (Signed)
Left pt a message stating his rx is ready for pick up at front desk.

## 2017-05-05 NOTE — Telephone Encounter (Signed)
Refill request: Percocet and Valium  Last RX:04/01/17 Last OV:02/01/17 Next OV: none scheduled  UDS:03/05/17 CSC:01/05/17

## 2017-05-06 MED FILL — OXYCODONE/APAP 7.5/325MG: 7.5-325 | 30 days supply | Qty: 60 | Fill #0

## 2017-05-06 MED FILL — diazePAM 5 MG TABS: 5 | 30 days supply | Qty: 60 | Fill #0

## 2017-06-01 ENCOUNTER — Telehealth: Payer: Self-pay | Admitting: Medical

## 2017-06-01 NOTE — Telephone Encounter (Signed)
Relation to WU:JWJX Call back number:[phone] Pharmacy: [facility] - [place], [place] - [address] [phone] (Phone) [phone] (Fax)     Reason for call:   Patient requesting 90 day supply, patient scheduled follow up with PA on 06/03/17  lisinopril (PRINIVIL,ZESTRIL) 20 MG tablet  amLODipine (NORVASC) 10 MG tablet  sertraline (ZOLOFT) 100 MG tablet

## 2017-06-02 MED ORDER — LISINOPRIL 20 MG PO TABS: 20.0000 mg | ORAL_TABLET | Freq: Every day | ORAL | 1 refills | Status: DC

## 2017-06-02 MED ORDER — AMLODIPINE BESYLATE 10 MG PO TABS: 10.0000 mg | ORAL_TABLET | Freq: Every day | ORAL | 1 refills | Status: DC

## 2017-06-02 MED ORDER — SERTRALINE HCL 100 MG PO TABS: 100.0000 mg | ORAL_TABLET | Freq: Every day | ORAL | 0 refills | Status: DC

## 2017-06-02 NOTE — Telephone Encounter (Signed)
Rx sent to pharmacy   

## 2017-06-03 ENCOUNTER — Telehealth: Payer: Self-pay | Admitting: Medical

## 2017-06-03 ENCOUNTER — Ambulatory Visit (INDEPENDENT_AMBULATORY_CARE_PROVIDER_SITE_OTHER): Payer: Self-pay | Admitting: Medical

## 2017-06-03 ENCOUNTER — Encounter: Payer: Self-pay | Admitting: Medical

## 2017-06-03 VITALS — BP 175/100 | HR 77 | Temp 98.6°F | Resp 16 | Ht 73.0 in | Wt 196.0 lb

## 2017-06-03 DIAGNOSIS — I10 Essential (primary) hypertension: Secondary | ICD-10-CM

## 2017-06-03 LAB — COMPREHENSIVE METABOLIC PANEL
ALBUMIN: 4.5 g/dL (ref 3.5–5.2)
ALT: 21 U/L (ref 0–53)
AST: 16 U/L (ref 0–37)
Alkaline Phosphatase: 47 U/L (ref 39–117)
BUN: 14 mg/dL (ref 6–23)
CHLORIDE: 102 meq/L (ref 96–112)
CO2: 30 mEq/L (ref 19–32)
CREATININE: 0.87 mg/dL (ref 0.40–1.50)
Calcium: 9.7 mg/dL (ref 8.4–10.5)
GFR: 95.84 mL/min (ref >60.00)
Glucose, Bld: 110 mg/dL — ABNORMAL HIGH (ref 70–99)
Potassium: 3.8 mEq/L (ref 3.5–5.1)
SODIUM: 138 meq/L (ref 135–145)
Total Bilirubin: 0.5 mg/dL (ref 0.2–1.2)
Total Protein: 6.8 g/dL (ref 6.0–8.3)

## 2017-06-03 MED ORDER — HYDROCHLOROTHIAZIDE 25 MG PO TABS: 25.0000 mg | ORAL_TABLET | Freq: Every day | ORAL | 1 refills | Status: DC

## 2017-06-03 MED ORDER — LISINOPRIL 20 MG PO TABS
20.0000 mg | ORAL_TABLET | Freq: Every day | ORAL | 1 refills | Status: DC
Start: 2017-06-03 — End: 2017-08-31

## 2017-06-03 MED ORDER — AMLODIPINE BESYLATE 10 MG PO TABS: 10.0000 mg | ORAL_TABLET | Freq: Every day | ORAL | 1 refills | Status: DC

## 2017-06-03 MED ORDER — OXYCODONE-ACETAMINOPHEN 7.5-325 MG PO TABS: ORAL_TABLET | ORAL | 0 refills | Status: DC

## 2017-06-03 MED ORDER — DIAZEPAM 5 MG PO TABS: 5.0000 mg | ORAL_TABLET | Freq: Two times a day (BID) | ORAL | 0 refills | Status: DC | PRN

## 2017-06-03 MED FILL — HYDROCHLOROTHIAZIDE 25 MG T: 25 | 30 days supply | Qty: 30 | Fill #0

## 2017-06-03 MED FILL — OXYCODON-ACETAMINOPHEN 7.5-: 7.5-325 | 30 days supply | Qty: 60 | Fill #0

## 2017-06-03 MED FILL — LISINOPRIL 20 MG TABS: 20 | 90 days supply | Qty: 90 | Fill #0

## 2017-06-03 MED FILL — AMLODIPINE BESYLATE 10 MG T: 10 | 90 days supply | Qty: 90 | Fill #0

## 2017-06-03 NOTE — Patient Instructions (Signed)
For your very high blood pressure today, I want to start all 3 of your blood pressure medications this morning. You have been off of your medications for 2 days so some elevation as expected. But I am concerned about level of elevation. You have a good neurologic exam today and no cardiac or neurologic symptoms. Please check your blood pressure at home and your cuff. I expect her blood pressure to start to come down. If any cardiac or neurologic signs or symptoms as explained then the seen at emergency department. Please schedule a nurse blood pressure follow-up on this coming Thursday.  For your chronic knee pain I am refilling your oxycodone. For your chronic anxiety I am refilling her Valium.   Please go by the lab and get CMP.  Follow-up date to be determined after lab review and the nurse blood pressure check.

## 2017-06-03 NOTE — Progress Notes (Signed)
Subjective:    Patient ID: Todd Jones, male    DOB: [birth year], 56 y.o.   MRN: [id number]  HPI  Pt in for follow up.  Pt states no bp medication for 2 days. Also drank 2 cups of coffee. Also has not slept well past two days since anniversary of wife death. But no cardiac or neurologic signs or symptoms.  Pt on amlodipine, lisinopril and hctz. No medication for 2 days. Pt states cost was too much but can get pay for meds today.  Pt has  2 days left of anxiety medication and pain medication. Currently he has up to date controlled med contract and had up to date uds. No access to [employer] data base since upgrade. Just found out about yesterday.     Review of Systems  Constitutional: Negative for chills, fatigue and fever.  HENT: Negative for congestion, drooling, facial swelling, postnasal drip, rhinorrhea, sinus pain, sinus pressure and sneezing.   Respiratory: Negative for cough, choking and wheezing.   Cardiovascular: Negative for chest pain and palpitations.  Gastrointestinal: Negative for abdominal pain, blood in stool, constipation, diarrhea and vomiting.  Genitourinary: Negative for dysuria and frequency.  Musculoskeletal: Negative for back pain.       Knee pain.  Skin: Negative for rash.  Neurological: Negative for dizziness, weakness, numbness and headaches.  Hematological: Negative for adenopathy. Does not bruise/bleed easily.  Psychiatric/Behavioral: Positive for sleep disturbance. Negative for behavioral problems, confusion, dysphoric mood, self-injury and suicidal ideas. The patient is nervous/anxious.      Past Medical History:  Diagnosis Date  . Arthritis   . GERD (gastroesophageal reflux disease)   . H/O hiatal hernia   . Hypertension   . Insomnia      Social History   Social History  . Marital status: Married    Spouse name: N/A  . Number of children: N/A  . Years of education: N/A   Occupational History  . Not on file.   Social History Main Topics    . Smoking status: Former Games developer  . Smokeless tobacco: Never Used  . Alcohol use No  . Drug use: Yes    Types: Marijuana  . Sexual activity: Not on file   Other Topics Concern  . Not on file   Social History Narrative  . No narrative on file    Past Surgical History:  Procedure Laterality Date  . CHOLECYSTECTOMY    . INGUINAL HERNIA REPAIR  10/14/2012   Procedure: LAPAROSCOPIC INGUINAL HERNIA;  Surgeon: Axel Filler, MD;  Location: [facility];  Service: General;  Laterality: Right;  . INSERTION OF MESH  10/14/2012   Procedure: INSERTION OF MESH;  Surgeon: Axel Filler, MD;  Location: [facility];  Service: General;  Laterality: Right;  . KNEE ARTHROSCOPY     right x 3  . KNEE FUSION      Family History  Problem Relation Age of Onset  . Cancer Maternal Grandfather        lung  . Alcohol abuse Maternal Grandfather     Allergies  Allergen Reactions  . Hydrocodone     Stomach upset  . Toprol Xl [Metoprolol Tartrate]     confusion    Current Outpatient Prescriptions on File Prior to Visit  Medication Sig Dispense Refill  . amLODipine (NORVASC) 10 MG tablet Take 1 tablet (10 mg total) by mouth daily. 90 tablet 1  . diazepam (VALIUM) 5 MG tablet Take 1 tablet (5 mg total) by mouth every 12 (twelve)  hours as needed for anxiety. 60 tablet 0  . hydrochlorothiazide (HYDRODIURIL) 25 MG tablet Take 1 tablet (25 mg total) by mouth daily. 30 tablet 1  . lisinopril (PRINIVIL,ZESTRIL) 20 MG tablet Take 1 tablet (20 mg total) by mouth daily. 90 tablet 1  . oxyCODONE-acetaminophen (PERCOCET) 7.5-325 MG tablet 1 tab po q 12 hours prn pain 60 tablet 0  . potassium chloride SA (K-DUR,KLOR-CON) 20 MEQ tablet Take 1 tablet (20 mEq total) by mouth daily. 30 tablet 0  . sertraline (ZOLOFT) 100 MG tablet Take 1 tablet (100 mg total) by mouth daily. 90 tablet 0   No current facility-administered medications on file prior to visit.     Pulse 77   Temp 98.6 F (37 C) (Oral)   Resp 16   Ht 6'  1" (1.854 m)   Wt 196 lb (88.9 kg)   SpO2 100%   BMI 25.86 kg/m      Objective:   Physical Exam  General Mental Status- Alert. General Appearance- Not in acute distress.   Skin General: Color- Normal Color. Moisture- Normal Moisture.  Neck Carotid Arteries- Normal color. Moisture- Normal Moisture. No carotid bruits. No JVD.  Chest and Lung Exam Auscultation: Breath Sounds:-Normal.  Cardiovascular Auscultation:Rythm- Regular. Murmurs & Other Heart Sounds:Auscultation of the heart reveals- No Murmurs.  Abdomen Inspection:-Inspeection Normal. Palpation/Percussion:Note:No mass. Palpation and Percussion of the abdomen reveal- Non Tender, Non Distended + BS, no rebound or guarding.   Neurologic Cranial Nerve exam:- CN III-XII intact(No nystagmus), symmetric smile. Drift Test:- No drift. Finger to Nose:- Normal/Intact Strength:- 5/5 equal and symmetric strength both upper and lower extremities.  Knees- severe bilateral crepitus.     Assessment & Plan:  For your very high blood pressure today, I want to start all 3 of your blood pressure medications this morning. You have been off of your medications for 2 days so some elevation as expected. But I am concerned about level of elevation. You have a good neurologic exam today and no cardiac or neurologic symptoms. Please check your blood pressure at home and your cuff. I expect her blood pressure to start to come down. If any cardiac or neurologic signs or symptoms as explained then the seen at emergency department. Please schedule a nurse blood pressure follow-up on this coming Thursday.  For your chronic knee pain I am refilling your oxycodone. For your chronic anxiety I am refilling her Valium.   Please go by the lab and get CMP.  Follow-up date to be determined after lab review and the nurse blood pressure check.  Sanam Marmo, Ramon Dredge, PA-C

## 2017-06-03 NOTE — Telephone Encounter (Signed)
Pt says that he is due to come back to have a BP check. Pt is self pay and would like to know the cost for a BP check. Pt scheduled apt but says that he would just like to be aware   Please assist and advise

## 2017-06-07 MED FILL — diazePAM 5 MG TABS: 5 | 30 days supply | Qty: 60 | Fill #0

## 2017-06-10 ENCOUNTER — Ambulatory Visit: Payer: Self-pay

## 2017-06-10 NOTE — Telephone Encounter (Signed)
Patient no show today for BP check scheduled.

## 2017-06-11 NOTE — Telephone Encounter (Signed)
Called patient left message for him to return call regarding BP check per request from E. Saguier, PA-C

## 2017-06-11 NOTE — Telephone Encounter (Signed)
Would you call pt and ask him to reschedule for that nurse bp check?

## 2017-07-02 ENCOUNTER — Telehealth: Payer: Self-pay | Admitting: Medical

## 2017-07-02 MED ORDER — DIAZEPAM 5 MG PO TABS: 5.0000 mg | ORAL_TABLET | Freq: Two times a day (BID) | ORAL | 0 refills | Status: DC | PRN

## 2017-07-02 MED ORDER — OXYCODONE-ACETAMINOPHEN 7.5-325 MG PO TABS: ORAL_TABLET | ORAL | 0 refills | Status: DC

## 2017-07-02 MED FILL — OXYCODON-ACETAMINOPHEN 7.5-: 7.5-325 | 30 days supply | Qty: 60 | Fill #0

## 2017-07-02 MED FILL — diazePAM 5 MG TABS: 5 | 30 days supply | Qty: 60 | Fill #0

## 2017-07-02 NOTE — Telephone Encounter (Signed)
Reviewed and approved prescription for his valium and Percocet.

## 2017-07-02 NOTE — Telephone Encounter (Signed)
Refill request: Percocet  Last RX:06/03/17 Last OV:06/03/17 Next UU:VOZDOV:none scheduled  UDS:03/05/17 CSC:01/05/17

## 2017-07-02 NOTE — Telephone Encounter (Signed)
Relation to WU:JWJXpt:self Call back number:[phone] Pharmacy:  Reason for call:  Patient requesting a refill diazepam (VALIUM) 5 MG tablet and oxyCODONE-acetaminophen (PERCOCET) 7.5-325 MG tablet. Patient wanted to make PCP aware that he currently doesn't have insurance and will schedule appointment in 45 days when insurance is active.

## 2017-08-02 ENCOUNTER — Telehealth: Payer: Self-pay | Admitting: Medical

## 2017-08-02 NOTE — Telephone Encounter (Signed)
Patient requesting refill for PERCOCET  & diazepam.

## 2017-08-03 MED ORDER — OXYCODONE-ACETAMINOPHEN 7.5-325 MG PO TABS: ORAL_TABLET | ORAL | 0 refills | Status: DC

## 2017-08-03 MED ORDER — DIAZEPAM 5 MG PO TABS
5.0000 mg | ORAL_TABLET | Freq: Two times a day (BID) | ORAL | 0 refills | Status: DC | PRN
Start: 2017-08-03 — End: 2017-08-31

## 2017-08-03 NOTE — Telephone Encounter (Addendum)
Refill Request: Percocet & Diazepam  Last RX:07/02/2017 Last OV:06/03/2017 Next NW:GNFAOV:None scheduled  UDS: 11/05/2016 CSC:01/05/2017

## 2017-08-03 NOTE — Telephone Encounter (Signed)
Will you print the prescription of his Percocet and Valium.  I will send the prescription.  Will you place it on my desk then I will sign.

## 2017-08-04 MED FILL — diazePAM 5 MG TABS: 5 | 30 days supply | Qty: 60 | Fill #0

## 2017-08-04 MED FILL — OXYCODON-ACETAMINOPHEN 7.5-: 7.5-325 | 30 days supply | Qty: 60 | Fill #0

## 2017-08-04 NOTE — Telephone Encounter (Signed)
Left pt a message notifying him rx is ready for pick up at front desk.

## 2017-08-31 ENCOUNTER — Ambulatory Visit (INDEPENDENT_AMBULATORY_CARE_PROVIDER_SITE_OTHER): Payer: Self-pay | Admitting: Medical

## 2017-08-31 ENCOUNTER — Encounter: Payer: Self-pay | Admitting: Medical

## 2017-08-31 ENCOUNTER — Telehealth: Payer: Self-pay | Admitting: Medical

## 2017-08-31 VITALS — BP 140/89 | HR 70 | Temp 98.6°F | Resp 16 | Ht 74.0 in | Wt 203.4 lb

## 2017-08-31 DIAGNOSIS — G8929 Other chronic pain: Secondary | ICD-10-CM

## 2017-08-31 DIAGNOSIS — M25561 Pain in right knee: Secondary | ICD-10-CM

## 2017-08-31 DIAGNOSIS — F411 Generalized anxiety disorder: Secondary | ICD-10-CM

## 2017-08-31 DIAGNOSIS — I10 Essential (primary) hypertension: Secondary | ICD-10-CM

## 2017-08-31 DIAGNOSIS — M25562 Pain in left knee: Secondary | ICD-10-CM

## 2017-08-31 DIAGNOSIS — Z79899 Other long term (current) drug therapy: Secondary | ICD-10-CM

## 2017-08-31 MED ORDER — OXYCODONE-ACETAMINOPHEN 7.5-325 MG PO TABS: ORAL_TABLET | ORAL | 0 refills | Status: DC

## 2017-08-31 MED ORDER — HYDROCHLOROTHIAZIDE 25 MG PO TABS: 25.0000 mg | ORAL_TABLET | Freq: Every day | ORAL | 1 refills | Status: DC

## 2017-08-31 MED ORDER — DIAZEPAM 5 MG PO TABS: 5.0000 mg | ORAL_TABLET | Freq: Two times a day (BID) | ORAL | 0 refills | Status: DC | PRN

## 2017-08-31 MED ORDER — LISINOPRIL 20 MG PO TABS: 20.0000 mg | ORAL_TABLET | Freq: Every day | ORAL | 1 refills | Status: DC

## 2017-08-31 MED ORDER — AMLODIPINE BESYLATE 10 MG PO TABS: 10.0000 mg | ORAL_TABLET | Freq: Every day | ORAL | 1 refills | Status: DC

## 2017-08-31 MED FILL — AMLODIPINE BESYLATE 10 MG T: 10 | 90 days supply | Qty: 90 | Fill #0

## 2017-08-31 MED FILL — LISINOPRIL 20 MG TABLET: 20 | 90 days supply | Qty: 90 | Fill #0

## 2017-08-31 NOTE — Patient Instructions (Addendum)
   Your blood pressure is borderline today.  I refilled your 3 blood pressure medications.  I would recommend that you consider getting better blood pressure cuff. Omron is considered a reliable brand.  If you do get cough then check blood pressure 2-3 times a week when you know you are relaxed.  Verify blood pressure readings less than 140/90.  For your chronic knee pain I printed postdated prescription of Percocet.  For anxiety, I printed postdated prescription of Valium.  Please give UDS today.(controlled med profile site reviewed.)  Follow-up in 4 months or as needed.

## 2017-08-31 NOTE — Progress Notes (Signed)
Subjective:    Patient ID: Todd Jones, male    DOB: 02/25/1961, 56 y.o.   MRN: [id number]  HPI  Pt in for follow up.  Pt bp initially higher than it was last time. He drank one cup of coffee today.  Pt on hctz, lisinopril and newest is amlodipine. Pt states he has been checking his bp at home. Some readings 140/80 but then some numbers even higher. He does not know what brand machine he has. He questions reliability of that cuff.  Pt on contract for percocet for chronic knee pain and valium for anxiety. Up to date on contract.   Pt declines flu vaccine.    Review of Systems  Constitutional: Negative for chills, fatigue and fever.  Respiratory: Negative for cough, chest tightness, shortness of breath and wheezing.   Cardiovascular: Negative for chest pain and palpitations.  Gastrointestinal: Negative for abdominal pain and anal bleeding.  Genitourinary: Negative for flank pain and frequency.  Musculoskeletal: Negative for back pain.       Knee pain bilateral.  Skin: Negative for rash.  Neurological: Negative for dizziness, facial asymmetry, speech difficulty, weakness, light-headedness and numbness.  Hematological: Negative for adenopathy. Does not bruise/bleed easily.  Psychiatric/Behavioral: Negative for behavioral problems, confusion, sleep disturbance and suicidal ideas. The patient is nervous/anxious.     Past Medical History:  Diagnosis Date  . Arthritis   . GERD (gastroesophageal reflux disease)   . H/O hiatal hernia   . Hypertension   . Insomnia      Social History   Socioeconomic History  . Marital status: Married    Spouse name: Not on file  . Number of children: Not on file  . Years of education: Not on file  . Highest education level: Not on file  Social Needs  . Financial resource strain: Not on file  . Food insecurity - worry: Not on file  . Food insecurity - inability: Not on file  . Transportation needs - medical: Not on file  .  Transportation needs - non-medical: Not on file  Occupational History  . Not on file  Tobacco Use  . Smoking status: Former Games developermoker  . Smokeless tobacco: Never Used  Substance and Sexual Activity  . Alcohol use: No  . Drug use: Yes    Types: Marijuana  . Sexual activity: Not on file  Other Topics Concern  . Not on file  Social History Narrative  . Not on file    Past Surgical History:  Procedure Laterality Date  . CHOLECYSTECTOMY    . INGUINAL HERNIA REPAIR  10/14/2012   Procedure: LAPAROSCOPIC INGUINAL HERNIA;  Surgeon: Axel FillerArmando Ramirez, MD;  Location: [facility];  Service: General;  Laterality: Right;  . INSERTION OF MESH  10/14/2012   Procedure: INSERTION OF MESH;  Surgeon: Axel FillerArmando Ramirez, MD;  Location: [facility];  Service: General;  Laterality: Right;  . KNEE ARTHROSCOPY     right x 3  . KNEE FUSION      Family History  Problem Relation Age of Onset  . Cancer Maternal Grandfather        lung  . Alcohol abuse Maternal Grandfather     Allergies  Allergen Reactions  . Hydrocodone     Stomach upset  . Toprol Xl [Metoprolol Tartrate]     confusion    Current Outpatient Medications on File Prior to Visit  Medication Sig Dispense Refill  . amLODipine (NORVASC) 10 MG tablet Take 1 tablet (10 mg total) by mouth  daily. 90 tablet 1  . diazepam (VALIUM) 5 MG tablet Take 1 tablet (5 mg total) by mouth every 12 (twelve) hours as needed for anxiety. Not to fill until 06-05-2017. 60 tablet 0  . hydrochlorothiazide (HYDRODIURIL) 25 MG tablet Take 1 tablet (25 mg total) by mouth daily. 30 tablet 1  . lisinopril (PRINIVIL,ZESTRIL) 20 MG tablet Take 1 tablet (20 mg total) by mouth daily. 90 tablet 1  . oxyCODONE-acetaminophen (PERCOCET) 7.5-325 MG tablet 1 tab po q 12 hours prn pain 60 tablet 0  . potassium chloride SA (K-DUR,KLOR-CON) 20 MEQ tablet Take 1 tablet (20 mEq total) by mouth daily. 30 tablet 0  . sertraline (ZOLOFT) 100 MG tablet Take 1 tablet (100 mg total) by mouth daily. 90  tablet 0   No current facility-administered medications on file prior to visit.     BP (!) 158/95   Pulse 70   Temp 98.6 F (37 C) (Oral)   Resp 16   Ht 6\' 2"  (1.88 m)   Wt 203 lb 6.4 oz (92.3 kg)   SpO2 100%   BMI 26.12 kg/m       Objective:   Physical Exam  General Mental Status- Alert. General Appearance- Not in acute distress.   Skin General: Color- Normal Color. Moisture- Normal Moisture.  Neck Carotid Arteries- Normal color. Moisture- Normal Moisture. No carotid bruits. No JVD.  Chest and Lung Exam Auscultation: Breath Sounds:-Normal.  Cardiovascular Auscultation:Rythm- Regular. Murmurs & Other Heart Sounds:Auscultation of the heart reveals- No Murmurs.  Abdomen Inspection:-Inspeection Normal. Palpation/Percussion:Note:No mass. Palpation and Percussion of the abdomen reveal- Non Tender, Non Distended + BS, no rebound or guarding.  Neurologic Cranial Nerve exam:- CN III-XII intact(No nystagmus), symmetric smile. Strength:- 5/5 equal and symmetric strength both upper and lower extremities.  Knees- severe knee crepitus.       Assessment & Plan:  Your blood pressure is borderline today.  I refilled your 3 blood pressure medications.  I would recommend that you consider getting better blood pressure cuff. Omron is considered a reliable brand.  If you do get cough then check blood pressure 2-3 times a week when you know you are relaxed.  Verify blood pressure readings less than 140/90.  For your chronic knee pain I printed postdated prescription of Percocet.  For anxiety, I printed postdated prescription of Valium.  Please give UDS today.  Follow-up in 4 months or as needed.   Nivedita Mirabella, Ramon DredgeEdward, PA-C

## 2017-08-31 NOTE — Telephone Encounter (Signed)
Patient's depression screening questionnaire looked much better today than in the past.  I gave that to you today on day of visit.  Would you please update his screening.  If you lost the seat by chance would you call him and go over it on the phone and then update.  Let me know what he says.  Particularly if he has poor score indicating depression as in 2017.  Note also last year his wife was fighting cancer and she eventually passed away.

## 2017-09-01 LAB — PAIN MGMT, PROFILE 8 W/CONF, U
6 ACETYLMORPHINE: NEGATIVE ng/mL (ref <10)
AMPHETAMINES: NEGATIVE ng/mL (ref <500)
Alcohol Metabolites: NEGATIVE ng/mL (ref <500)
BUPRENORPHINE, URINE: NEGATIVE ng/mL (ref <5)
Benzodiazepines: NEGATIVE ng/mL (ref <100)
Cocaine Metabolite: NEGATIVE ng/mL (ref <150)
Creatinine: 116.2 mg/dL
MARIJUANA METABOLITE: NEGATIVE ng/mL (ref <20)
MDMA: NEGATIVE ng/mL (ref <500)
OPIATES: NEGATIVE ng/mL (ref <100)
OXIDANT: NEGATIVE ug/mL (ref <200)
OXYCODONE: NEGATIVE ng/mL (ref <100)
pH: 8.9 (ref 4.5–9.0)

## 2017-09-01 NOTE — Telephone Encounter (Signed)
Updated depression screen.

## 2017-09-02 MED FILL — diazePAM 5 MG TABS: 5 | 30 days supply | Qty: 60 | Fill #0

## 2017-09-02 MED FILL — OXYCODON-ACETAMINOPHEN 7.5-: 7.5-325 | 30 days supply | Qty: 60 | Fill #0

## 2017-09-30 ENCOUNTER — Telehealth: Payer: Self-pay | Admitting: Medical

## 2017-09-30 MED ORDER — OXYCODONE-ACETAMINOPHEN 7.5-325 MG PO TABS: ORAL_TABLET | ORAL | 0 refills | Status: DC

## 2017-09-30 MED ORDER — DIAZEPAM 5 MG PO TABS: 5.0000 mg | ORAL_TABLET | Freq: Two times a day (BID) | ORAL | 0 refills | Status: DC | PRN

## 2017-09-30 NOTE — Telephone Encounter (Signed)
Diazepam and Percocet refill. Both medications refilled on 09/02/17 with 60 tabs.

## 2017-09-30 NOTE — Telephone Encounter (Signed)
Copied from CRM [id number]. Topic: Quick Communication - Rx Refill/Question >> Sep 30, 2017 11:37 AM Everardo PacificMoton, Katyana Trolinger, NT wrote: Medication: Diazepam and Percocet   Has the patient contacted their pharmacy? Yes   Patient was told to contact the office for a hard script for the two medication due to the fact that they are a controlled substance   Preferred Pharmacy (with phone number or street name): [facility] [place] [address] [phone]   Agent: Please be advised that RX refills may take up to 3 business days. We ask that you follow-up with your pharmacy.

## 2017-09-30 NOTE — Telephone Encounter (Signed)
Refill Request: Percocet & Diazepam  Last RX: 08/31/2017 Last OV:08/31/2017 Next OV: None scheduled UDS:08/31/2017 CSC:01/05/2017

## 2017-09-30 NOTE — Telephone Encounter (Signed)
Prescription for Percocet and diazepam.  Next office visit to be in April.

## 2017-10-01 MED FILL — OXYCODON-ACETAMINOPHEN 7.5-: 7.5-325 | 30 days supply | Qty: 60 | Fill #0

## 2017-10-01 MED FILL — diazePAM 5 MG TABS: 5 | 30 days supply | Qty: 60 | Fill #0

## 2017-10-01 NOTE — Telephone Encounter (Signed)
Pt.notified

## 2017-10-29 ENCOUNTER — Other Ambulatory Visit: Payer: Self-pay | Admitting: Medical

## 2017-10-29 ENCOUNTER — Telehealth: Payer: Self-pay | Admitting: Medical

## 2017-10-29 NOTE — Telephone Encounter (Signed)
Copied from CRM [phone]. Topic: Quick Communication - Rx Refill/Question >> Oct 29, 2017 12:54 PM Crist InfanteHarrald, Kathy J wrote: Medication: oxyCODONE-acetaminophen (PERCOCET) 7.5-325 MG tablet                     diazepam (VALIUM) 5 MG tablet  [facility] [place] [address] [phone] (Phone) [phone] (Fax)  Pt picked up last time. So pharmacy has never had it electronically sent

## 2017-11-01 MED ORDER — DIAZEPAM 5 MG PO TABS: 5.0000 mg | ORAL_TABLET | Freq: Two times a day (BID) | ORAL | 0 refills | Status: DC | PRN

## 2017-11-01 MED ORDER — OXYCODONE-ACETAMINOPHEN 7.5-325 MG PO TABS: ORAL_TABLET | ORAL | 0 refills | Status: DC

## 2017-11-01 NOTE — Telephone Encounter (Signed)
Requesting: Valium Contract: 12/2416 UDS: 08/31/17 Last Visit: 08/31/17 Next Visit:  none Last Refill: 09/30/17  Please Advise

## 2017-11-01 NOTE — Telephone Encounter (Signed)
Refill Request: Valium & Percocet  Last RX:09/30/2017 Last OV:08/31/2018 Next EA:[id number]:None scheduled  UDS:08/31/2017 CSC:01/05/2017

## 2017-11-01 NOTE — Telephone Encounter (Signed)
Patient calling to check up on the status of his RX please call pt when its ready

## 2017-11-02 ENCOUNTER — Telehealth: Payer: Self-pay | Admitting: Medical

## 2017-11-02 DIAGNOSIS — Z79899 Other long term (current) drug therapy: Secondary | ICD-10-CM

## 2017-11-02 MED ORDER — OXYCODONE-ACETAMINOPHEN 7.5-325 MG PO TABS: ORAL_TABLET | ORAL | 0 refills | Status: DC

## 2017-11-02 MED ORDER — DIAZEPAM 5 MG PO TABS: 5.0000 mg | ORAL_TABLET | Freq: Two times a day (BID) | ORAL | 0 refills | Status: DC | PRN

## 2017-11-02 MED FILL — OXYCODON-ACETAMINOPHEN 7.5-: 7.5-325 | 30 days supply | Qty: 60 | Fill #0

## 2017-11-02 MED FILL — diazePAM 5 MG TABS: 5 | 15 days supply | Qty: 30 | Fill #0

## 2017-11-02 NOTE — Telephone Encounter (Signed)
Jasmine printed those prescriptions but I am not going to sign those.  Would you ask patient and clarify what pharmacy he uses.  I can send those  electronically.  I am going to give him the same number of Percocet as I have been doing.  If you would let patient know that I am going to give him less number of Valium that he typically gets.  I discussed his prescription use of Percocet and Valium with Dr. Laury AxonLowne.  Benzodiazepines are not recommended to use with pain medications.  I believe he was on both prescriptions for a while even before he saw me.  Dr. Laury AxonLowne wants him to use less Valium per month.  This would decrease chance of interaction.  So we will send that medication electronically.  Let patient know we can discuss this further on his next visit.

## 2017-11-02 NOTE — Telephone Encounter (Signed)
Addressed in 10/29/17 phone note.

## 2017-11-02 NOTE — Telephone Encounter (Signed)
Todd Jones-- chart review shows below Rxs were printed today but I cannot find them in the office. I called the pharmacy and they did not have them.  Can you resend them electronically or print them in the office tomorrow?

## 2017-11-02 NOTE — Telephone Encounter (Signed)
I did send the prescriptions down to our pharmacy/med center.  I accidentally printed another prescription for Percocet.  Would you mind getting that prescription off the printer and shaking it.  Also would you advise patient that when he picks up the prescription downstairs could he come upstairs and sign a new controlled medication contract.  Thanks

## 2017-11-02 NOTE — Telephone Encounter (Signed)
I went ahead and sent the prescriptions downstairs electronically.

## 2017-11-02 NOTE — Telephone Encounter (Signed)
Notified pt and he voices understanding and is agreeable to complete controlled substance contract when he picks up medications from pharmacy. Pt confirmed [facility] for primary pharmacy and contract has been placed at front office for completion. Rx that previously printed today has been shredded.

## 2017-11-25 ENCOUNTER — Other Ambulatory Visit: Payer: Self-pay

## 2017-11-25 DIAGNOSIS — Z79899 Other long term (current) drug therapy: Secondary | ICD-10-CM

## 2017-11-25 MED ORDER — OXYCODONE-ACETAMINOPHEN 7.5-325 MG PO TABS: ORAL_TABLET | ORAL | 0 refills | Status: DC

## 2017-11-25 NOTE — Addendum Note (Signed)
Addended by: Steve RattlerBLEVINS, BAILEY A on: 11/25/2017 01:21 PM   Modules accepted: Orders

## 2017-11-28 LAB — PAIN MGMT, PROFILE 8 W/CONF, U
6 Acetylmorphine: NEGATIVE ng/mL (ref <10)
ALCOHOL METABOLITES: NEGATIVE ng/mL (ref <500)
ALPHAHYDROXYALPRAZOLAM: NEGATIVE ng/mL (ref <25)
ALPHAHYDROXYTRIAZOLAM: NEGATIVE ng/mL (ref <50)
AMPHETAMINES: NEGATIVE ng/mL (ref <500)
Alphahydroxymidazolam: NEGATIVE ng/mL (ref <50)
Aminoclonazepam: NEGATIVE ng/mL (ref <25)
Benzodiazepines: NEGATIVE ng/mL (ref <100)
Buprenorphine, Urine: NEGATIVE ng/mL (ref <5)
COCAINE METABOLITE: NEGATIVE ng/mL (ref <150)
CREATININE: 112.7 mg/dL
HYDROXYETHYLFLURAZEPAM: NEGATIVE ng/mL (ref <50)
Lorazepam: NEGATIVE ng/mL (ref <50)
MARIJUANA METABOLITE: NEGATIVE ng/mL (ref <20)
MDMA: NEGATIVE ng/mL (ref <500)
Marijuana Metabolite: NEGATIVE ng/mL (ref <5)
Nordiazepam: NEGATIVE ng/mL (ref <50)
OPIATES: NEGATIVE ng/mL (ref <100)
OXAZEPAM: NEGATIVE ng/mL (ref <50)
OXIDANT: NEGATIVE ug/mL (ref <200)
OXYCODONE: NEGATIVE ng/mL (ref <100)
PH: 9.1 — AB (ref 4.5–9.0)
Temazepam: NEGATIVE ng/mL (ref <50)

## 2017-11-29 MED FILL — AMLODIPINE BESYLATE 10 MG T: 10 | 90 days supply | Qty: 90 | Fill #1

## 2017-11-29 MED FILL — LISINOPRIL 20 MG TABLET: 20 | 90 days supply | Qty: 90 | Fill #1

## 2017-11-30 MED FILL — OXYCODON-ACETAMINOPHEN 7.5-: 7.5-325 | 30 days supply | Qty: 60 | Fill #0

## 2017-12-01 NOTE — Telephone Encounter (Addendum)
Request for Diazepam refill.   LOV:08/31/17  Edwena BundeEdward Saguier,PA   [facility]

## 2017-12-01 NOTE — Telephone Encounter (Signed)
Pt called in to follow up on refill request for diazepam. Pt says that pharmacy received Rx for oxycodone but not diazepam.    Please assist further.

## 2017-12-02 MED ORDER — DIAZEPAM 5 MG PO TABS: 5.0000 mg | ORAL_TABLET | Freq: Two times a day (BID) | ORAL | 0 refills | Status: DC | PRN

## 2017-12-02 NOTE — Telephone Encounter (Signed)
Refill Request: Diazepam  Last RX:11/02/2017 Last OV:08/31/2017 Next ZO:[id number]:None Scheduled  UDS:11/25/2017 CSC:11/25/2017

## 2017-12-02 NOTE — Telephone Encounter (Signed)
I did refill his Valium but go ahead and advised him he needs to come in for follow-up in 1 month due to the controlled medication policy/[place] laws.

## 2017-12-02 NOTE — Addendum Note (Signed)
Addended by: Gwenevere AbbotSAGUIER, Kalyani Maeda M on: 12/02/2017 06:53 PM   Modules accepted: Orders

## 2017-12-03 MED FILL — diazePAM 5 MG TABS: 5 | 15 days supply | Qty: 30 | Fill #0

## 2017-12-06 NOTE — Telephone Encounter (Signed)
Called pt and left voicemail for pt to call us back and schedule a 1 month follow up appt.

## 2017-12-06 NOTE — Telephone Encounter (Signed)
Please call and schedule pt for a follow up in one month.

## 2017-12-30 ENCOUNTER — Ambulatory Visit (INDEPENDENT_AMBULATORY_CARE_PROVIDER_SITE_OTHER): Payer: Self-pay | Admitting: Medical

## 2017-12-30 ENCOUNTER — Encounter: Payer: Self-pay | Admitting: Medical

## 2017-12-30 VITALS — BP 130/84 | HR 78 | Resp 16 | Ht 74.0 in | Wt 204.0 lb

## 2017-12-30 DIAGNOSIS — F419 Anxiety disorder, unspecified: Secondary | ICD-10-CM

## 2017-12-30 DIAGNOSIS — I10 Essential (primary) hypertension: Secondary | ICD-10-CM

## 2017-12-30 DIAGNOSIS — G4700 Insomnia, unspecified: Secondary | ICD-10-CM

## 2017-12-30 MED ORDER — HYDROXYZINE HCL 25 MG PO TABS: ORAL_TABLET | ORAL | 0 refills | Status: DC

## 2017-12-30 MED ORDER — DIAZEPAM 5 MG PO TABS: 5.0000 mg | ORAL_TABLET | Freq: Two times a day (BID) | ORAL | 0 refills | Status: DC | PRN

## 2017-12-30 MED ORDER — OXYCODONE-ACETAMINOPHEN 7.5-325 MG PO TABS: ORAL_TABLET | ORAL | 0 refills | Status: DC

## 2017-12-30 MED FILL — hydrOXYzine HCL 25 MG TABS: 25 | 10 days supply | Qty: 10 | Fill #0

## 2017-12-30 MED FILL — OXYCODONE/APAP 7.5/325MG: 7.5-325 | 30 days supply | Qty: 60 | Fill #0

## 2017-12-30 MED FILL — diazePAM 5 MG TABS: 5 | 15 days supply | Qty: 30 | Fill #0

## 2017-12-30 NOTE — Patient Instructions (Addendum)
Your blood pressure is very well controlled today.  Continue current medication regimen.  For your anxiety and insomnia, I do think is a good idea for you to try hydroxyzine 25 mg 1 tablet at night and see if this helps.  This would likely be a better option than Valium in light of your Percocet use for chronic knee pain.  If hydroxyzine has no impact then will consider prescribing course of Ambien in place of Valium.  Knee pain controlled with Percocet.  Holding off on getting labs today.  Hopefully he will get job with benefits within the next 3 months when labs would need to be drawn.  Follow-up in 3 months or as needed.

## 2017-12-30 NOTE — Progress Notes (Signed)
Subjective:    Patient ID: Todd Jones, male    DOB: September 23, 1960, 57 y.o.   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  Pt bp is well controlled today. No cardiac or neurologic signs or symptoms.  Pt has recent controlled med refills. Up to date on contract. Last prescription for his med written in March, 2019.   His pain is controlled. His anxiety controlled.   Review of Systems  Constitutional: Negative for chills, fatigue and fever.  HENT: Negative for congestion, drooling, ear discharge, facial swelling, hearing loss, nosebleeds, postnasal drip and sinus pain.   Respiratory: Negative for apnea, cough, chest tightness, shortness of breath and wheezing.   Cardiovascular: Negative for chest pain and palpitations.  Gastrointestinal: Negative for abdominal distention, abdominal pain, blood in stool, constipation and vomiting.  Genitourinary: Negative for enuresis and flank pain.  Musculoskeletal: Negative for back pain and gait problem.  Skin: Negative for color change and rash.  Neurological: Negative for dizziness, speech difficulty, weakness, numbness and headaches.  Hematological: Negative for adenopathy. Does not bruise/bleed easily.  Psychiatric/Behavioral: Negative for behavioral problems and confusion.   Past Medical History:  Diagnosis Date  . Arthritis   . GERD (gastroesophageal reflux disease)   . H/O hiatal hernia   . Hypertension   . Insomnia      Social History   Socioeconomic History  . Marital status: Married    Spouse name: Not on file  . Number of children: Not on file  . Years of education: Not on file  . Highest education level: Not on file  Occupational History  . Not on file  Social Needs  . Financial resource strain: Not on file  . Food insecurity:    Worry: Not on file    Inability: Not on file    . Transportation needs:    Medical: Not on file    Non-medical: Not on file  Tobacco Use  . Smoking status: Former Games developer  . Smokeless tobacco: Never Used  Substance and Sexual Activity  . Alcohol use: No  . Drug use: Yes    Types: Marijuana  . Sexual activity: Not on file  Lifestyle  . Physical activity:    Days per week: Not on file    Minutes per session: Not on file  . Stress: Not on file  Relationships  . Social connections:    Talks on phone: Not on file    Gets together: Not on file    Attends religious service: Not on file    Active member of club or organization: Not on file    Attends meetings of clubs or organizations: Not on file    Relationship status: Not on file  . Intimate partner violence:    Fear of current or ex partner: Not on file    Emotionally abused: Not on file    Physically abused: Not on file    Forced sexual activity: Not on file  Other Topics Concern  . Not on file  Social History Narrative  . Not on file    Past Surgical History:  Procedure Laterality Date  . CHOLECYSTECTOMY    . INGUINAL HERNIA REPAIR  10/14/2012   Procedure: LAPAROSCOPIC INGUINAL HERNIA;  Surgeon: Axel Filler, MD;  Location: [facility];  Service: General;  Laterality: Right;  . INSERTION  OF MESH  10/14/2012   Procedure: INSERTION OF MESH;  Surgeon: Axel FillerArmando Ramirez, MD;  Location: [facility];  Service: General;  Laterality: Right;  . KNEE ARTHROSCOPY     right x 3  . KNEE FUSION      Family History  Problem Relation Age of Onset  . Cancer Maternal Grandfather        lung  . Alcohol abuse Maternal Grandfather     Allergies  Allergen Reactions  . Hydrocodone     Stomach upset  . Toprol Xl [Metoprolol Tartrate]     confusion    Current Outpatient Medications on File Prior to Visit  Medication Sig Dispense Refill  . amLODipine (NORVASC) 10 MG tablet Take 1 tablet (10 mg total) by mouth daily. 90 tablet 1  . diazepam (VALIUM) 5 MG tablet Take 1 tablet (5 mg total)  by mouth every 12 (twelve) hours as needed for anxiety. 30 tablet 0  . hydrochlorothiazide (HYDRODIURIL) 25 MG tablet Take 1 tablet (25 mg total) by mouth daily. 90 tablet 1  . lisinopril (PRINIVIL,ZESTRIL) 20 MG tablet Take 1 tablet (20 mg total) by mouth daily. 90 tablet 1  . oxyCODONE-acetaminophen (PERCOCET) 7.5-325 MG tablet 1 tab po q 12 hours prn pain 60 tablet 0  . potassium chloride SA (K-DUR,KLOR-CON) 20 MEQ tablet Take 1 tablet (20 mEq total) by mouth daily. 30 tablet 0  . sertraline (ZOLOFT) 100 MG tablet Take 1 tablet (100 mg total) by mouth daily. (Patient not taking: Reported on 12/30/2017) 90 tablet 0   No current facility-administered medications on file prior to visit.     BP 130/84 (BP Location: Left Arm, Patient Position: Sitting, Cuff Size: Normal)   Pulse 78   Resp 16   Ht 6\' 2"  (1.88 m)   Wt 204 lb (92.5 kg)   SpO2 99%   BMI 26.19 kg/m       Objective:   Physical Exam  General Mental Status- Alert. General Appearance- Not in acute distress.   Skin General: Color- Normal Color. Moisture- Normal Moisture.  Neck Carotid Arteries- Normal color. Moisture- Normal Moisture. No carotid bruits. No JVD.  Chest and Lung Exam Auscultation: Breath Sounds:-Normal.  Cardiovascular Auscultation:Rythm- Regular. Murmurs & Other Heart Sounds:Auscultation of the heart reveals- No Murmurs.  Abdomen Inspection:-Inspeection Normal. Palpation/Percussion:Note:No mass. Palpation and Percussion of the abdomen reveal- Non Tender, Non Distended + BS, no rebound or guarding.  Neurologic Cranial Nerve exam:- CN III-XII intact(No nystagmus), symmetric smile. Strength:- 5/5 equal and symmetric strength both upper and lower extremities.  Knees- good range of motion but crepitus        Assessment & Plan:  Your blood pressure is very well controlled today.  Continue current medication regimen.  For your anxiety and insomnia, I do think is a good idea for you to try  hydroxyzine 25 mg 1 tablet at night and see if this helps.  This would likely be a better option than Valium in light of your Percocet use for chronic knee pain.  If hydroxyzine has no impact then will consider prescribing course of Ambien in place of Valium.  Knee pain controlled with Percocet.  Holding off on getting labs today.  Hopefully he will get job with benefits within the next 3 months when labs would need to be drawn.  Follow-up in 3 months or as needed.  Esperanza RichtersEdward Tashaun Obey, PA-C

## 2018-01-31 ENCOUNTER — Other Ambulatory Visit: Payer: Self-pay | Admitting: Medical

## 2018-01-31 NOTE — Telephone Encounter (Signed)
Pt called to check the status of Rx request, e-scripts have been sent from pharmacy

## 2018-02-01 ENCOUNTER — Telehealth: Payer: Self-pay | Admitting: Medical

## 2018-02-01 MED ORDER — OXYCODONE-ACETAMINOPHEN 7.5-325 MG PO TABS: ORAL_TABLET | ORAL | 0 refills | Status: DC

## 2018-02-01 MED ORDER — DIAZEPAM 5 MG PO TABS: 5.0000 mg | ORAL_TABLET | Freq: Two times a day (BID) | ORAL | 0 refills | Status: DC | PRN

## 2018-02-01 MED FILL — OXYCODONE/APAP 7.5/325MG: 7.5-325 | 30 days supply | Qty: 60 | Fill #0

## 2018-02-01 MED FILL — diazePAM 5 MG TABS: 5 | 8 days supply | Qty: 15 | Fill #0

## 2018-02-01 NOTE — Telephone Encounter (Signed)
Oxycodone/APAP refill Last OV: 12/30/17 Last Refill:12/30/17 #60 tab No RF Pharmacy:[facility] PCP: Esperanza Richters PA  diazepam refill Last OV: 12/30/17 Last Refill:12/30/17 #30 tab No RF

## 2018-02-01 NOTE — Telephone Encounter (Signed)
Refilled his percocet. Gave limited valium rx of 2 weeks only. I had tried to rx hydroxyzine as replacement. Want to discuss this with him in 2 wks as he is requesting refill of valium rather than hydroxyzine.

## 2018-02-28 ENCOUNTER — Other Ambulatory Visit: Payer: Self-pay | Admitting: Medical

## 2018-03-03 ENCOUNTER — Telehealth: Payer: Self-pay

## 2018-03-03 NOTE — Telephone Encounter (Signed)
I sent in refill percocet. Want to discuss other med with pt. I am out next week. So will you have him scheduled in about 2 weeks.

## 2018-03-03 NOTE — Telephone Encounter (Signed)
Pt. requesting percocet refill, last refilled 02/01/18. Per note from Whole FoodsEdward Saguier, [place] wanted to see him in the office two weeks from 5/21 to address medications. Author phoned pt. to discuss request for appointment, but no answer. Author left VM asking pt. to call back to make an appointment to address at [phone]. Esperanza RichtersEdward Saguier, PA made aware.

## 2018-03-03 NOTE — Telephone Encounter (Signed)
Rx percocet sent to pt pharmacy. 

## 2018-03-03 NOTE — Telephone Encounter (Signed)
Requesting: percocet 7.5-325 (1) tab q 12 hr prn Contract: yes, 2019 UDS: 11/25/17 Last OV: 12/30/17 Next Ov: 03/18/18 Last refill: 02/01/18, 60tabs, 0RF Database: no discrepancies found.

## 2018-03-04 ENCOUNTER — Ambulatory Visit (INDEPENDENT_AMBULATORY_CARE_PROVIDER_SITE_OTHER): Payer: Self-pay | Admitting: Medical

## 2018-03-04 MED FILL — OXYCODONE/APAP 7.5/325MG: 7.5-325 | 30 days supply | Qty: 60 | Fill #0

## 2018-03-09 NOTE — Progress Notes (Signed)
   Subjective:    Patient ID: Todd Jones, male    DOB: 01/27/1961, 57 y.o.   MRN: [id number]  HPI erroneos encounter. No charge.   Review of Systems     Objective:   Physical Exam        Assessment & Plan:

## 2018-03-10 NOTE — Telephone Encounter (Signed)
Pt is scheduled for March 15, 2018 at 9:00 am.

## 2018-03-15 ENCOUNTER — Ambulatory Visit: Payer: Self-pay | Admitting: Medical

## 2018-03-18 ENCOUNTER — Ambulatory Visit: Payer: Self-pay | Admitting: Medical

## 2018-03-21 ENCOUNTER — Ambulatory Visit (INDEPENDENT_AMBULATORY_CARE_PROVIDER_SITE_OTHER): Payer: Self-pay | Admitting: Medical

## 2018-03-21 ENCOUNTER — Encounter: Payer: Self-pay | Admitting: Medical

## 2018-03-21 VITALS — BP 150/95 | HR 91 | Temp 98.2°F | Resp 16 | Ht 74.0 in | Wt 198.2 lb

## 2018-03-21 DIAGNOSIS — I10 Essential (primary) hypertension: Secondary | ICD-10-CM

## 2018-03-21 DIAGNOSIS — G8929 Other chronic pain: Secondary | ICD-10-CM

## 2018-03-21 DIAGNOSIS — G4700 Insomnia, unspecified: Secondary | ICD-10-CM

## 2018-03-21 DIAGNOSIS — M25562 Pain in left knee: Secondary | ICD-10-CM

## 2018-03-21 DIAGNOSIS — M25561 Pain in right knee: Secondary | ICD-10-CM

## 2018-03-21 MED ORDER — LISINOPRIL 20 MG PO TABS: 20.0000 mg | ORAL_TABLET | Freq: Every day | ORAL | 1 refills | Status: DC

## 2018-03-21 MED ORDER — AMLODIPINE BESYLATE 10 MG PO TABS: 10.0000 mg | ORAL_TABLET | Freq: Every day | ORAL | 1 refills | Status: DC

## 2018-03-21 MED ORDER — ZOLPIDEM TARTRATE 10 MG PO TABS: 10.0000 mg | ORAL_TABLET | Freq: Every evening | ORAL | 0 refills | Status: DC | PRN

## 2018-03-21 MED FILL — AMLODIPINE BESYLATE 10 MG T: 10 | 90 days supply | Qty: 90 | Fill #0

## 2018-03-21 MED FILL — ZOLPIDEM TARTRATE 10 MG TAB: 10 | 30 days supply | Qty: 30 | Fill #0

## 2018-03-21 MED FILL — LISINOPRIL 20 MG TABLET: 20 | 90 days supply | Qty: 90 | Fill #0

## 2018-03-21 NOTE — Patient Instructions (Addendum)
For high blood pressure refilled both blood pressure medications. BP high due to running out of lisinopril recently.    For chronic knee pain recommend left knee xray. Will refill percocet when needed.  For insomnia, will rx ambien. Use limited as possible/as discussed.  Follow up 3-4 months or as needed

## 2018-03-21 NOTE — Progress Notes (Signed)
Subjective:    Patient ID: Todd Jones, male    DOB: [birth year], 57 y.o.   MRN: [id number]  HPI  Pt in for follow up.  His blood pressure is not controlled today. But he ran out of lisinopril. No cardiac or neurologic signs or symptoms.  Pt also update me on his knee pain. Recently more severe. Hx of knee surgery/scope on rt knee. He had delayed knee replacements most recently when his wife was sick and he was taking care of her. He never had arthroscopic surgery on his left knee. Pt know does not have insurance. He states some difficulty trying to walk at times.   He wants to help with sleep. Formerly had him on valium but took him off due to percocet use.  Pt never had been on ambien. He states most of time just getting 4 hours of sleep. Years since youth has not slept well.    Review of Systems  Constitutional: Negative for chills, fatigue and fever.  HENT: Negative for congestion, ear pain, rhinorrhea, sinus pain and sneezing.   Respiratory: Negative for cough, chest tightness, shortness of breath and wheezing.   Gastrointestinal: Positive for nausea. Negative for abdominal distention, abdominal pain, blood in stool, constipation, diarrhea, rectal pain and vomiting.       Slight he associates with bp mild elevation.  Musculoskeletal: Negative for back pain and neck stiffness.  Skin: Negative for rash.  Neurological: Negative for dizziness, tremors, facial asymmetry, speech difficulty, weakness and light-headedness.  Hematological: Negative for adenopathy. Does not bruise/bleed easily.  Psychiatric/Behavioral: Negative for behavioral problems, hallucinations and suicidal ideas. The patient is not nervous/anxious.      Past Medical History:  Diagnosis Date  . Arthritis   . GERD (gastroesophageal reflux disease)   . H/O hiatal hernia   . Hypertension   . Insomnia      Social History   Socioeconomic History  . Marital status: Married    Spouse name: Not on file  .  Number of children: Not on file  . Years of education: Not on file  . Highest education level: Not on file  Occupational History  . Not on file  Social Needs  . Financial resource strain: Not on file  . Food insecurity:    Worry: Not on file    Inability: Not on file  . Transportation needs:    Medical: Not on file    Non-medical: Not on file  Tobacco Use  . Smoking status: Former Games developer  . Smokeless tobacco: Never Used  Substance and Sexual Activity  . Alcohol use: No  . Drug use: Yes    Types: Marijuana  . Sexual activity: Not on file  Lifestyle  . Physical activity:    Days per week: Not on file    Minutes per session: Not on file  . Stress: Not on file  Relationships  . Social connections:    Talks on phone: Not on file    Gets together: Not on file    Attends religious service: Not on file    Active member of club or organization: Not on file    Attends meetings of clubs or organizations: Not on file    Relationship status: Not on file  . Intimate partner violence:    Fear of current or ex partner: Not on file    Emotionally abused: Not on file    Physically abused: Not on file    Forced sexual activity: Not on file  Other Topics Concern  . Not on file  Social History Narrative  . Not on file    Past Surgical History:  Procedure Laterality Date  . CHOLECYSTECTOMY    . INGUINAL HERNIA REPAIR  10/14/2012   Procedure: LAPAROSCOPIC INGUINAL HERNIA;  Surgeon: Axel FillerArmando Ramirez, MD;  Location: [facility];  Service: General;  Laterality: Right;  . INSERTION OF MESH  10/14/2012   Procedure: INSERTION OF MESH;  Surgeon: Axel FillerArmando Ramirez, MD;  Location: [facility];  Service: General;  Laterality: Right;  . KNEE ARTHROSCOPY     right x 3  . KNEE FUSION      Family History  Problem Relation Age of Onset  . Cancer Maternal Grandfather        lung  . Alcohol abuse Maternal Grandfather     Allergies  Allergen Reactions  . Hydrocodone     Stomach upset  . Toprol Xl  [Metoprolol Tartrate]     confusion    Current Outpatient Medications on File Prior to Visit  Medication Sig Dispense Refill  . diazepam (VALIUM) 5 MG tablet Take 1 tablet (5 mg total) by mouth every 12 (twelve) hours as needed for anxiety. 15 tablet 0  . hydrOXYzine (ATARAX/VISTARIL) 25 MG tablet 1 tab po q hs as needed anxiety/insomnia 10 tablet 0  . oxyCODONE-acetaminophen (PERCOCET) 7.5-325 MG tablet TAKE 1 TABLET BY MOUTH EVERY 12 HOURS AS NEEDED FOR PAIN 60 tablet 0  . potassium chloride SA (K-DUR,KLOR-CON) 20 MEQ tablet Take 1 tablet (20 mEq total) by mouth daily. 30 tablet 0  . sertraline (ZOLOFT) 100 MG tablet Take 1 tablet (100 mg total) by mouth daily. 90 tablet 0   No current facility-administered medications on file prior to visit.     BP (!) 162/104   Pulse 91   Temp 98.2 F (36.8 C) (Oral)   Resp 16   Ht 6\' 2"  (1.88 m)   Wt 198 lb 3.2 oz (89.9 kg)   SpO2 100%   BMI 25.45 kg/m       Objective:   Physical Exam  General Mental Status- Alert. General Appearance- Not in acute distress.   Skin General: Color- Normal Color. Moisture- Normal Moisture.  Neck Carotid Arteries- Normal color. Moisture- Normal Moisture. No carotid bruits. No JVD.  Chest and Lung Exam Auscultation: Breath Sounds:-Normal.  Cardiovascular Auscultation:Rythm- Regular. Murmurs & Other Heart Sounds:Auscultation of the heart reveals- No Murmurs.  Abdomen Inspection:-Inspeection Normal. Palpation/Percussion:Note:No mass. Palpation and Percussion of the abdomen reveal- Non Tender, Non Distended + BS, no rebound or guarding.    Neurologic Cranial Nerve exam:- CN III-XII intact(No nystagmus), symmetric smile. Strength:- 5/5 equal and symmetric strength both upper and lower extremities.     Assessment & Plan:  For high blood pressure refilled both blood pressure medications.  BP high due to running out of lisinopril recently.    For chronic knee pain recommend left knee xray. Will  refill percocet when needed.  For insomnia, will rx ambien. Use limited as possible/as discussed.  Follow up 3-4 months or as needed  Whole FoodsEdward Adyan Palau, VF CorporationPA-C

## 2018-04-05 ENCOUNTER — Telehealth: Payer: Self-pay | Admitting: Medical

## 2018-04-05 ENCOUNTER — Other Ambulatory Visit: Payer: Self-pay

## 2018-04-05 MED ORDER — OXYCODONE-ACETAMINOPHEN 7.5-325 MG PO TABS: ORAL_TABLET | ORAL | 0 refills | Status: DC

## 2018-04-05 MED FILL — OXYCODONE/APAP 7.5/325MG: 7.5-325 | 30 days supply | Qty: 60 | Fill #0

## 2018-04-05 NOTE — Telephone Encounter (Signed)
Copied from CRM [phone]. Topic: Quick Communication - Rx Refill/Question >> Apr 05, 2018  8:27 AM Oneal GroutSebastian, Jennifer S wrote: Medication: oxyCODONE-acetaminophen (PERCOCET) 7.5-325 MG tablet   Has the patient contacted their pharmacy? Yes.   (Agent: If no, request that the patient contact the pharmacy for the refill.) (Agent: If yes, when and what did the pharmacy advise?)  Preferred Pharmacy (with phone number or street name): [facility]  Agent: Please be advised that RX refills may take up to 3 business days. We ask that you follow-up with your pharmacy.

## 2018-04-05 NOTE — Progress Notes (Signed)
Refill Request:Percocet  Last RX:03/03/18 Last OV:03/21/18 Next OV: none scheduled  UDS:11/25/17 CSC:11/25/17

## 2018-04-05 NOTE — Telephone Encounter (Signed)
Sent patient rx percocet to his pharmacy. Reviewed pt contract and uds hx.

## 2018-04-06 NOTE — Telephone Encounter (Signed)
Filled yesterday; available at preferred RX

## 2018-05-02 ENCOUNTER — Other Ambulatory Visit: Payer: Self-pay | Admitting: Medical

## 2018-05-02 NOTE — Telephone Encounter (Signed)
Refill Request: Oxycodone   Last RX: 04/05/18 Last OV:03/21/18 Next ZO:XWRUOV:None scheduled UDS:11/25/17 CSC:11/25/17 CSR:

## 2018-05-02 NOTE — Telephone Encounter (Signed)
Refilled pt oxycodone.

## 2018-05-03 MED FILL — OXYCODONE/APAP 7.5/325MG: 7.5-325 | 30 days supply | Qty: 60 | Fill #0

## 2018-05-31 ENCOUNTER — Telehealth: Payer: Self-pay | Admitting: Medical

## 2018-05-31 NOTE — Telephone Encounter (Signed)
Will you ask patient to follow up in one month for controlled med visit. Sent in percocet today.

## 2018-05-31 NOTE — Telephone Encounter (Signed)
Refill request:Percocet  Last RX:05/02/18 Last OV:03/21/18 Next ZO:XWRUOV:None Scheduled  UDS:11/25/17 CSC:11/25/17 CSR:05/31/18

## 2018-06-01 NOTE — Telephone Encounter (Signed)
Called pt and informed him that his Rx was sent in, but he was due for a med check in about 1 month. Pt stated that he would call in and schedule once he knows his schedule, as it changes frequently.

## 2018-06-02 MED FILL — OXYCODONE/APAP 7.5/325MG: 7.5-325 | 30 days supply | Qty: 60 | Fill #0

## 2018-06-17 MED FILL — AMLODIPINE BESYLATE 10 MG T: 10 | 90 days supply | Qty: 90 | Fill #1

## 2018-06-17 MED FILL — LISINOPRIL 20 MG TABLET: 20 | 90 days supply | Qty: 90 | Fill #1

## 2018-06-30 ENCOUNTER — Encounter: Payer: Self-pay | Admitting: Medical

## 2018-06-30 ENCOUNTER — Ambulatory Visit (INDEPENDENT_AMBULATORY_CARE_PROVIDER_SITE_OTHER): Payer: Self-pay | Admitting: Medical

## 2018-06-30 VITALS — BP 152/96 | HR 76 | Temp 97.6°F | Resp 16 | Ht 77.0 in | Wt 201.0 lb

## 2018-06-30 DIAGNOSIS — M25561 Pain in right knee: Secondary | ICD-10-CM

## 2018-06-30 DIAGNOSIS — G8929 Other chronic pain: Secondary | ICD-10-CM

## 2018-06-30 DIAGNOSIS — G4700 Insomnia, unspecified: Secondary | ICD-10-CM

## 2018-06-30 DIAGNOSIS — M25562 Pain in left knee: Secondary | ICD-10-CM

## 2018-06-30 DIAGNOSIS — F411 Generalized anxiety disorder: Secondary | ICD-10-CM

## 2018-06-30 DIAGNOSIS — I10 Essential (primary) hypertension: Secondary | ICD-10-CM

## 2018-06-30 LAB — COMPREHENSIVE METABOLIC PANEL
ALBUMIN: 4.8 g/dL (ref 3.5–5.2)
ALT: 14 U/L (ref 0–53)
AST: 11 U/L (ref 0–37)
Alkaline Phosphatase: 58 U/L (ref 39–117)
BUN: 15 mg/dL (ref 6–23)
CHLORIDE: 104 meq/L (ref 96–112)
CO2: 26 meq/L (ref 19–32)
Calcium: 9.8 mg/dL (ref 8.4–10.5)
Creatinine, Ser: 0.82 mg/dL (ref 0.40–1.50)
GFR: 102.23 mL/min (ref >60.00)
Glucose, Bld: 108 mg/dL — ABNORMAL HIGH (ref 70–99)
POTASSIUM: 4.7 meq/L (ref 3.5–5.1)
SODIUM: 139 meq/L (ref 135–145)
Total Bilirubin: 0.4 mg/dL (ref 0.2–1.2)
Total Protein: 7.2 g/dL (ref 6.0–8.3)

## 2018-06-30 MED ORDER — OXYCODONE-ACETAMINOPHEN 7.5-325 MG PO TABS: 1.0000 | ORAL_TABLET | Freq: Two times a day (BID) | ORAL | 0 refills | Status: DC | PRN

## 2018-06-30 MED ORDER — BUSPIRONE HCL 15 MG PO TABS: 15.0000 mg | ORAL_TABLET | Freq: Two times a day (BID) | ORAL | 0 refills | Status: DC

## 2018-06-30 MED ORDER — HYDROCHLOROTHIAZIDE 12.5 MG PO CAPS: 12.5000 mg | ORAL_CAPSULE | Freq: Every day | ORAL | 3 refills | Status: DC

## 2018-06-30 MED ORDER — TRAZODONE HCL 50 MG PO TABS: 25.0000 mg | ORAL_TABLET | Freq: Every evening | ORAL | 3 refills | Status: DC | PRN

## 2018-06-30 MED FILL — busPIRone HCL 15 MG TABS: 15 | 30 days supply | Qty: 60 | Fill #0

## 2018-06-30 MED FILL — OXYCODONE/APAP 7.5/325MG: 7.5-325 | 30 days supply | Qty: 60 | Fill #0

## 2018-06-30 MED FILL — HYDROCHLOROTHIAZIDE 12.5 MG: 12.5 | 30 days supply | Qty: 30 | Fill #0

## 2018-06-30 MED FILL — traZODone HCL 50 MG TABS: 50 | 30 days supply | Qty: 30 | Fill #0

## 2018-06-30 NOTE — Patient Instructions (Addendum)
Your bp is high today. Will add hctz to your current regimen. Make sure you eat potassium rich diet. Please check bp at home and give up date in 2 weeks on bp level.  For chronic knee pain refilling your oxycodone. Rx advisement.  For anxiety will rx buspar.   For insomnia rx trazadone. Rx advisement on starting this at separate time to see if any side effects.  Follow up 4 months or as needed.

## 2018-06-30 NOTE — Progress Notes (Signed)
Subjective:    Patient ID: Todd Jones, male    DOB: 1961/09/11, 57 y.o.   MRN: [id number]  HPI  Pt in for follow up.  His blood pressure is elevated today initially. No cardiac or neurologic signs or symptoms.  Pt has chronic knee pain from severer degenerative joint disease. Pt had some severe crepitus on exam historically. Pt uses oxycodone for years. Pt is up to date on contract and uds..  He has history of insomnia and anxiety, in the past pt had been on valium in past. I had taken him off of that in past. Due to fact that he required oxycodone and did not want him to use benzo with pain meds. Pt tried [place] and it did not help. Failed hydroxyzine in past and did not help. Pt has not tried trazadone.  Regarding anxiety he does noest during the day he is feeling anxiety.    Review of Systems  Constitutional: Negative for chills, fatigue and fever.  HENT: Negative for congestion, ear pain and postnasal drip.   Respiratory: Negative for cough, chest tightness, shortness of breath and wheezing.   Cardiovascular: Negative for chest pain and palpitations.  Gastrointestinal: Negative for abdominal pain.  Musculoskeletal: Negative for back pain.       Knee pain.  Skin: Negative for rash.  Neurological: Negative for dizziness, speech difficulty, weakness, light-headedness and headaches.  Hematological: Negative for adenopathy.  Psychiatric/Behavioral: Positive for sleep disturbance. Negative for agitation, behavioral problems and dysphoric mood. The patient is nervous/anxious.     Past Medical History:  Diagnosis Date  . Arthritis   . GERD (gastroesophageal reflux disease)   . H/O hiatal hernia   . Hypertension   . Insomnia      Social History   Socioeconomic History  . Marital status: Married    Spouse name: Not on file  . Number of children: Not on file  . Years of education: Not on file  . Highest education level: Not on file  Occupational History  . Not on file   Social Needs  . Financial resource strain: Not on file  . Food insecurity:    Worry: Not on file    Inability: Not on file  . Transportation needs:    Medical: Not on file    Non-medical: Not on file  Tobacco Use  . Smoking status: Former Games developer  . Smokeless tobacco: Never Used  Substance and Sexual Activity  . Alcohol use: No  . Drug use: Yes    Types: Marijuana  . Sexual activity: Not on file  Lifestyle  . Physical activity:    Days per week: Not on file    Minutes per session: Not on file  . Stress: Not on file  Relationships  . Social connections:    Talks on phone: Not on file    Gets together: Not on file    Attends religious service: Not on file    Active member of club or organization: Not on file    Attends meetings of clubs or organizations: Not on file    Relationship status: Not on file  . Intimate partner violence:    Fear of current or ex partner: Not on file    Emotionally abused: Not on file    Physically abused: Not on file    Forced sexual activity: Not on file  Other Topics Concern  . Not on file  Social History Narrative  . Not on file    Past  Surgical History:  Procedure Laterality Date  . CHOLECYSTECTOMY    . INGUINAL HERNIA REPAIR  10/14/2012   Procedure: LAPAROSCOPIC INGUINAL HERNIA;  Surgeon: Axel Filler, MD;  Location: [facility];  Service: General;  Laterality: Right;  . INSERTION OF MESH  10/14/2012   Procedure: INSERTION OF MESH;  Surgeon: Axel Filler, MD;  Location: [facility];  Service: General;  Laterality: Right;  . KNEE ARTHROSCOPY     right x 3  . KNEE FUSION      Family History  Problem Relation Age of Onset  . Cancer Maternal Grandfather        lung  . Alcohol abuse Maternal Grandfather     Allergies  Allergen Reactions  . Hydrocodone     Stomach upset  . Toprol Xl [Metoprolol Tartrate]     confusion    Current Outpatient Medications on File Prior to Visit  Medication Sig Dispense Refill  . amLODipine (NORVASC)  10 MG tablet Take 1 tablet (10 mg total) by mouth daily. 90 tablet 1  . lisinopril (PRINIVIL,ZESTRIL) 20 MG tablet Take 1 tablet (20 mg total) by mouth daily. 90 tablet 1  . oxyCODONE-acetaminophen (PERCOCET) 7.5-325 MG tablet TAKE 1 TABLET BY MOUTH EVERY 12 HOURS AS NEEDED FOR PAIN 60 tablet 0  . potassium chloride SA (K-DUR,KLOR-CON) 20 MEQ tablet Take 1 tablet (20 mEq total) by mouth daily. 30 tablet 0   No current facility-administered medications on file prior to visit.     BP (!) 152/96   Pulse 76   Temp 97.6 F (36.4 C) (Oral)   Resp 16   Ht 6\' 5"  (1.956 m)   Wt 201 lb (91.2 kg)   SpO2 100%   BMI 23.84 kg/m       Objective:   Physical Exam  General Mental Status- Alert. General Appearance- Not in acute distress.   Skin General: Color- Normal Color. Moisture- Normal Moisture.  Neck Carotid Arteries- Normal color. Moisture- Normal Moisture. No carotid bruits. No JVD.  Chest and Lung Exam Auscultation: Breath Sounds:-Normal.  Cardiovascular Auscultation:Rythm- Regular. Murmurs & Other Heart Sounds:Auscultation of the heart reveals- No Murmurs.  Abdomen Inspection:-Inspeection Normal. Palpation/Percussion:Note:No mass. Palpation and Percussion of the abdomen reveal- Non Tender, Non Distended + BS, no rebound or guarding.  Knees- bilateral severe crepitus both knees.       Assessment & Plan:  Your bp is high today. Will add hctz to your current regimen. Make sure you eat potassium rich diet.  For chronic knee pain refilling your oxycodone. Rx advisement.  For anxiety will rx buspar.   For insomnia rx trazadone. Rx advisement on starting this at separate time to see if any side effects.  Follow up 4 months or as needed.  Esperanza Richters, PA-C

## 2018-07-29 ENCOUNTER — Other Ambulatory Visit: Payer: Self-pay | Admitting: Medical

## 2018-08-01 NOTE — Telephone Encounter (Signed)
Rx oxycodone sent to pt pharmacy. 

## 2018-08-01 NOTE — Telephone Encounter (Signed)
Refill Request: Percocet  Last RX:06/30/18 Last OV:06/30/18 Next ZO:XWRUOV:None scheduled UDS:11/25/17 CSC:11/25/17 CSR:

## 2018-08-02 MED FILL — HYDROCHLOROTHIAZIDE 12.5 MG: 12.5 | 30 days supply | Qty: 30 | Fill #1

## 2018-08-02 MED FILL — OXYCODONE/APAP 7.5/325MG: 7.5-325 | 30 days supply | Qty: 60 | Fill #0

## 2018-08-29 ENCOUNTER — Other Ambulatory Visit: Payer: Self-pay | Admitting: Medical

## 2018-09-01 NOTE — Telephone Encounter (Signed)
Refilled pt percocet. Up to date on contract, uds and narx reviewed past.

## 2018-09-01 NOTE — Telephone Encounter (Signed)
Patient calling to check status of this refill. Please advise.  

## 2018-09-02 MED FILL — OXYCODONE/APAP 7.5/325MG: 7.5-325 | 30 days supply | Qty: 60 | Fill #0

## 2018-09-12 MED FILL — traZODone HCL 50 MG TABS: 50 | 30 days supply | Qty: 30 | Fill #1

## 2018-09-29 ENCOUNTER — Encounter: Payer: Self-pay | Admitting: Medical

## 2018-09-29 ENCOUNTER — Ambulatory Visit (INDEPENDENT_AMBULATORY_CARE_PROVIDER_SITE_OTHER): Payer: Self-pay | Admitting: Medical

## 2018-09-29 VITALS — BP 164/103 | HR 85 | Temp 98.2°F | Resp 16 | Ht 74.0 in | Wt 195.0 lb

## 2018-09-29 DIAGNOSIS — R82998 Other abnormal findings in urine: Secondary | ICD-10-CM

## 2018-09-29 DIAGNOSIS — Z125 Encounter for screening for malignant neoplasm of prostate: Secondary | ICD-10-CM

## 2018-09-29 DIAGNOSIS — R739 Hyperglycemia, unspecified: Secondary | ICD-10-CM

## 2018-09-29 DIAGNOSIS — Z79899 Other long term (current) drug therapy: Secondary | ICD-10-CM

## 2018-09-29 DIAGNOSIS — I10 Essential (primary) hypertension: Secondary | ICD-10-CM

## 2018-09-29 DIAGNOSIS — E785 Hyperlipidemia, unspecified: Secondary | ICD-10-CM

## 2018-09-29 LAB — POC URINALSYSI DIPSTICK (AUTOMATED)
Blood, UA: NEGATIVE
Glucose, UA: NEGATIVE
Ketones, UA: NEGATIVE
Nitrite, UA: NEGATIVE
Protein, UA: POSITIVE — AB
Urobilinogen, UA: 2 E.U./dL — AB

## 2018-09-29 LAB — LIPID PANEL
CHOLESTEROL: 155 mg/dL (ref 0–200)
HDL: 47.8 mg/dL (ref >39.00)
LDL Cholesterol: 90 mg/dL (ref 0–99)
NonHDL: 107.21
Total CHOL/HDL Ratio: 3
Triglycerides: 87 mg/dL (ref 0.0–149.0)
VLDL: 17.4 mg/dL (ref 0.0–40.0)

## 2018-09-29 LAB — COMPREHENSIVE METABOLIC PANEL
ALT: 14 U/L (ref 0–53)
AST: 12 U/L (ref 0–37)
Albumin: 4.6 g/dL (ref 3.5–5.2)
Alkaline Phosphatase: 59 U/L (ref 39–117)
BILIRUBIN TOTAL: 0.5 mg/dL (ref 0.2–1.2)
BUN: 14 mg/dL (ref 6–23)
CO2: 28 mEq/L (ref 19–32)
Calcium: 9.8 mg/dL (ref 8.4–10.5)
Chloride: 105 mEq/L (ref 96–112)
Creatinine, Ser: 0.89 mg/dL (ref 0.40–1.50)
GFR: 92.93 mL/min (ref >60.00)
Glucose, Bld: 125 mg/dL — ABNORMAL HIGH (ref 70–99)
Potassium: 4.3 mEq/L (ref 3.5–5.1)
Sodium: 140 mEq/L (ref 135–145)
Total Protein: 6.7 g/dL (ref 6.0–8.3)

## 2018-09-29 LAB — PSA: PSA: 2.25 ng/mL (ref 0.10–4.00)

## 2018-09-29 LAB — URINALYSIS, MICROSCOPIC ONLY
RBC / HPF: NONE SEEN (ref 0–?)
WBC, UA: NONE SEEN (ref 0–?)

## 2018-09-29 LAB — HEMOGLOBIN A1C: Hgb A1c MFr Bld: 5.4 % (ref 4.6–6.5)

## 2018-09-29 MED ORDER — LISINOPRIL 20 MG PO TABS: 20.0000 mg | ORAL_TABLET | Freq: Every day | ORAL | 3 refills | Status: DC

## 2018-09-29 MED ORDER — HYDROCHLOROTHIAZIDE 12.5 MG PO CAPS: 12.5000 mg | ORAL_CAPSULE | Freq: Every day | ORAL | 3 refills | Status: DC

## 2018-09-29 MED ORDER — TRAZODONE HCL 50 MG PO TABS: 25.0000 mg | ORAL_TABLET | Freq: Every evening | ORAL | 5 refills | Status: DC | PRN

## 2018-09-29 MED ORDER — BUSPIRONE HCL 15 MG PO TABS: 15.0000 mg | ORAL_TABLET | Freq: Two times a day (BID) | ORAL | 3 refills | Status: DC

## 2018-09-29 MED ORDER — OXYCODONE-ACETAMINOPHEN 7.5-325 MG PO TABS: 1.0000 | ORAL_TABLET | Freq: Two times a day (BID) | ORAL | 0 refills | Status: DC | PRN

## 2018-09-29 MED FILL — OXYCODONE/APAP 7.5/325MG: 7.5-325 | 30 days supply | Qty: 60 | Fill #0

## 2018-09-29 MED FILL — busPIRone HCL 15 MG TABS: 15 | 30 days supply | Qty: 60 | Fill #0

## 2018-09-29 MED FILL — LISINOPRIL 20 MG TABLET: 20 | 90 days supply | Qty: 90 | Fill #0

## 2018-09-29 MED FILL — HYDROCHLOROTHIAZIDE 12.5 MG: 12.5 | 90 days supply | Qty: 90 | Fill #0

## 2018-09-29 MED FILL — traZODone HCL 50 MG TABS: 50 | 30 days supply | Qty: 30 | Fill #0

## 2018-09-29 NOTE — Progress Notes (Signed)
Subjective:    Patient ID: Todd Jones, male    DOB: [birth year], 58 y.o.   MRN: [id number]  HPI  Pt out of his bp med for 2 days. His bp is up today. No cardiac or neurologic signs or symptoms.  Pt lost some weight after more exercise and eating less.  He states his knees hurt more recently at time with more walking. Pt has history of severe degenerative joint disease. Severe crepitus. Finances/no medical insurance  in past has been barrier to him getting knee replaced. Chronic severe pain. He uses oxycocodone. He is on contract and gave uds. He is up to date.  In the past pt sugars have been mild elevated.  Hx of insomnia and trazadone is working.  Pt has anxiety and buspar is working.     Review of Systems  Constitutional: Negative for chills, fatigue and fever.  Respiratory: Negative for cough, chest tightness, shortness of breath and wheezing.   Cardiovascular: Negative for chest pain and palpitations.  Gastrointestinal: Negative for abdominal distention and abdominal pain.  Musculoskeletal: Negative for back pain.       Knee pain  Skin: Negative for rash.  Neurological: Negative for dizziness, speech difficulty, weakness and headaches.  Hematological: Negative for adenopathy. Does not bruise/bleed easily.  Psychiatric/Behavioral: Positive for sleep disturbance. Negative for behavioral problems, confusion and suicidal ideas. The patient is nervous/anxious.    Past Medical History:  Diagnosis Date  . Arthritis   . GERD (gastroesophageal reflux disease)   . H/O hiatal hernia   . Hypertension   . Insomnia      Social History   Socioeconomic History  . Marital status: Married    Spouse name: Not on file  . Number of children: Not on file  . Years of education: Not on file  . Highest education level: Not on file  Occupational History  . Not on file  Social Needs  . Financial resource strain: Not on file  . Food insecurity:    Worry: Not on file   Inability: Not on file  . Transportation needs:    Medical: Not on file    Non-medical: Not on file  Tobacco Use  . Smoking status: Former Games developer  . Smokeless tobacco: Never Used  Substance and Sexual Activity  . Alcohol use: No  . Drug use: Yes    Types: Marijuana  . Sexual activity: Not on file  Lifestyle  . Physical activity:    Days per week: Not on file    Minutes per session: Not on file  . Stress: Not on file  Relationships  . Social connections:    Talks on phone: Not on file    Gets together: Not on file    Attends religious service: Not on file    Active member of club or organization: Not on file    Attends meetings of clubs or organizations: Not on file    Relationship status: Not on file  . Intimate partner violence:    Fear of current or ex partner: Not on file    Emotionally abused: Not on file    Physically abused: Not on file    Forced sexual activity: Not on file  Other Topics Concern  . Not on file  Social History Narrative  . Not on file    Past Surgical History:  Procedure Laterality Date  . CHOLECYSTECTOMY    . INGUINAL HERNIA REPAIR  10/14/2012   Procedure: LAPAROSCOPIC INGUINAL HERNIA;  Surgeon:  Axel FillerArmando Ramirez, MD;  Location: [facility];  Service: General;  Laterality: Right;  . INSERTION OF MESH  10/14/2012   Procedure: INSERTION OF MESH;  Surgeon: Axel FillerArmando Ramirez, MD;  Location: [facility];  Service: General;  Laterality: Right;  . KNEE ARTHROSCOPY     right x 3  . KNEE FUSION      Family History  Problem Relation Age of Onset  . Cancer Maternal Grandfather        lung  . Alcohol abuse Maternal Grandfather     Allergies  Allergen Reactions  . Hydrocodone     Stomach upset  . Toprol Xl [Metoprolol Tartrate]     confusion    Current Outpatient Medications on File Prior to Visit  Medication Sig Dispense Refill  . amLODipine (NORVASC) 10 MG tablet Take 1 tablet (10 mg total) by mouth daily. 90 tablet 1  . busPIRone (BUSPAR) 15 MG tablet  Take 1 tablet (15 mg total) by mouth 2 (two) times daily. 60 tablet 0  . hydrochlorothiazide (MICROZIDE) 12.5 MG capsule Take 1 capsule (12.5 mg total) by mouth daily. 30 capsule 3  . lisinopril (PRINIVIL,ZESTRIL) 20 MG tablet Take 1 tablet (20 mg total) by mouth daily. 90 tablet 1  . oxyCODONE-acetaminophen (PERCOCET) 7.5-325 MG tablet TAKE 1 TABLET BY MOUTH EVERY 12 HOURS AS NEEDED FOR PAIN 60 tablet 0  . potassium chloride SA (K-DUR,KLOR-CON) 20 MEQ tablet Take 1 tablet (20 mEq total) by mouth daily. 30 tablet 0  . traZODone (DESYREL) 50 MG tablet Take 0.5-1 tablets (25-50 mg total) by mouth at bedtime as needed for sleep. 30 tablet 3   No current facility-administered medications on file prior to visit.     BP (!) 164/103   Pulse 85   Temp 98.2 F (36.8 C) (Oral)   Resp 16   Ht 6\' 2"  (1.88 m)   Wt 195 lb (88.5 kg)   SpO2 99%   BMI 25.04 kg/m       Objective:   Physical Exam   General Mental Status- Alert. General Appearance- Not in acute distress.    Neck Carotid Arteries- Normal color. Moisture- Normal Moisture. No carotid bruits. No JVD.  Chest and Lung Exam Auscultation: Breath Sounds:-Normal.  Cardiovascular Auscultation:Rythm- Regular. Murmurs & Other Heart Sounds:Auscultation of the heart reveals- No Murmurs.  Abdomen Inspection:-Inspeection Normal. Palpation/Percussion:Note:No mass. Palpation and Percussion of the abdomen reveal- Non Tender, Non Distended + BS, no rebound or guarding.   Neurologic Cranial Nerve exam:- CN III-XII intact(No nystagmus), symmetric smile. Strength:- 5/5 equal and symmetric strength both upper and lower extremities.  Knees- good rom, bilateral severe crepitus.    Assessment & Plan:  Your blood pressure is high today but you ran out of medications 2 days ago.  I did refill your medications today.  Please start checking your blood pressure daily we need to confirm that your blood pressure decreases to a level below 140/90.   Please call in about 5 to 7 days or my chart so I can know that your blood pressure is controlled.  History of mild sugar elevation in the past.  We will get A1c today/check 464-month sugar average.  For history of low HDL, will repeat your lipid panel test today.  We will get screening PSA today.  For your chronic severe knee pain, I did refill your Percocet.  Getting you could sign updated contract and give a UDS today.  Follow-up in 4 to 5 months or as needed.

## 2018-09-29 NOTE — Patient Instructions (Signed)
Your blood pressure is high today but you ran out of medications 2 days ago.  I did refill your medications today.  Please start checking your blood pressure daily we need to confirm that your blood pressure decreases to a level below 140/90.  Please call in about 5 to 7 days or my chart so I can know that your blood pressure is controlled.  History of mild sugar elevation in the past.  We will get A1c today/check 19-month sugar average.  For history of low HDL, will repeat your lipid panel test today.  We will get screening PSA today.  For your chronic severe knee pain, I did refill your Percocet.  Getting you could sign updated contract and give a UDS today.  Follow-up in 4 to 5 months or as needed.

## 2018-09-30 LAB — PAIN MGMT, PROFILE 8 W/CONF, U
6 Acetylmorphine: NEGATIVE ng/mL (ref <10)
Alcohol Metabolites: NEGATIVE ng/mL (ref <500)
Amphetamines: NEGATIVE ng/mL (ref <500)
Benzodiazepines: NEGATIVE ng/mL (ref <100)
Buprenorphine, Urine: NEGATIVE ng/mL (ref <5)
CREATININE: 103.7 mg/dL
Cocaine Metabolite: NEGATIVE ng/mL (ref <150)
MDMA: NEGATIVE ng/mL (ref <500)
Marijuana Metabolite: NEGATIVE ng/mL (ref <20)
Opiates: NEGATIVE ng/mL (ref <100)
Oxidant: NEGATIVE ug/mL (ref <200)
Oxycodone: NEGATIVE ng/mL (ref <100)
PH: 8.9 (ref 4.5–9.0)

## 2018-10-03 ENCOUNTER — Telehealth: Payer: Self-pay | Admitting: *Deleted

## 2018-10-03 NOTE — Telephone Encounter (Signed)
Patient notified of lab results- not in basket. Patient notified of results and appointment scheduled.

## 2018-10-17 ENCOUNTER — Ambulatory Visit: Payer: Self-pay | Admitting: Medical

## 2018-10-19 ENCOUNTER — Encounter: Payer: Self-pay | Admitting: Medical

## 2018-10-19 ENCOUNTER — Ambulatory Visit (INDEPENDENT_AMBULATORY_CARE_PROVIDER_SITE_OTHER): Payer: Self-pay | Admitting: Medical

## 2018-10-19 VITALS — BP 147/90 | HR 76 | Temp 98.2°F | Resp 12 | Ht 77.0 in | Wt 204.0 lb

## 2018-10-19 DIAGNOSIS — R82998 Other abnormal findings in urine: Secondary | ICD-10-CM

## 2018-10-19 DIAGNOSIS — R972 Elevated prostate specific antigen [PSA]: Secondary | ICD-10-CM

## 2018-10-19 DIAGNOSIS — I10 Essential (primary) hypertension: Secondary | ICD-10-CM

## 2018-10-19 DIAGNOSIS — R822 Biliuria: Secondary | ICD-10-CM

## 2018-10-19 DIAGNOSIS — R809 Proteinuria, unspecified: Secondary | ICD-10-CM

## 2018-10-19 MED ORDER — AMLODIPINE BESYLATE 10 MG PO TABS: 10.0000 mg | ORAL_TABLET | Freq: Every day | ORAL | 1 refills | Status: DC

## 2018-10-19 NOTE — Patient Instructions (Addendum)
You had very dark concentrated appearance to your urine the other day on wellness exam.  You have been hydrating well over the last 2 weeks and I want to repeat your urine microscopic examination, get urine culture and repeat metabolic panel.  Continue to decrease your caffeine intake.  Your PSA velocity was slightly increased on last visit and we will repeat a PSA today.  We will follow labs and let you know those results. Considering might refer you to urologist.  Also decide to get acute comprehensive hepatitis panel.  Follow-up date to be determined after lab review.

## 2018-10-19 NOTE — Progress Notes (Signed)
Subjective:    Patient ID: Todd Jones, male    DOB: 11/26/1960, 58 y.o.   MRN: [id number]  HPI Pt in for follow up.  I had wanted him to come back on based on very dark concentrated appearance to his urine last visit. It has been 2 weeks and he states he has been aggressively hydrating since last visit. Pt admits before he did last labs he was drinking about pot of coffee a day. Also drinking some sodas.  Pt also had slight increase of his psa value over 2 years.  Will repeat today. Pt mentioned some faint suprapubic pain about 3 weeks ago but that went away. Then he states that pressure was preceding bm's.      Review of Systems  Constitutional: Negative for chills, fatigue and fever.  Respiratory: Negative for cough, chest tightness, shortness of breath and wheezing.   Cardiovascular: Negative for chest pain and palpitations.  Gastrointestinal: Negative for abdominal distention and anal bleeding.  Genitourinary: Negative for decreased urine volume, difficulty urinating, enuresis, flank pain, frequency, penile pain, scrotal swelling and urgency.       Some mild difficulty initiating urine flow.  Musculoskeletal: Negative for back pain, myalgias, neck pain and neck stiffness.  Skin: Negative for rash.  Neurological: Negative for dizziness and headaches.  Hematological: Negative for adenopathy.  Psychiatric/Behavioral: Negative for behavioral problems and confusion.    Past Medical History:  Diagnosis Date  . Arthritis   . GERD (gastroesophageal reflux disease)   . H/O hiatal hernia   . Hypertension   . Insomnia      Social History   Socioeconomic History  . Marital status: Married    Spouse name: Not on file  . Number of children: Not on file  . Years of education: Not on file  . Highest education level: Not on file  Occupational History  . Not on file  Social Needs  . Financial resource strain: Not on file  . Food insecurity:    Worry: Not on file   Inability: Not on file  . Transportation needs:    Medical: Not on file    Non-medical: Not on file  Tobacco Use  . Smoking status: Former Games developermoker  . Smokeless tobacco: Never Used  Substance and Sexual Activity  . Alcohol use: No  . Drug use: Yes    Types: Marijuana  . Sexual activity: Not on file  Lifestyle  . Physical activity:    Days per week: Not on file    Minutes per session: Not on file  . Stress: Not on file  Relationships  . Social connections:    Talks on phone: Not on file    Gets together: Not on file    Attends religious service: Not on file    Active member of club or organization: Not on file    Attends meetings of clubs or organizations: Not on file    Relationship status: Not on file  . Intimate partner violence:    Fear of current or ex partner: Not on file    Emotionally abused: Not on file    Physically abused: Not on file    Forced sexual activity: Not on file  Other Topics Concern  . Not on file  Social History Narrative  . Not on file    Past Surgical History:  Procedure Laterality Date  . CHOLECYSTECTOMY    . INGUINAL HERNIA REPAIR  10/14/2012   Procedure: LAPAROSCOPIC INGUINAL HERNIA;  Surgeon: Jed LimerickArmando  Derrell Lolling, MD;  Location: [facility];  Service: General;  Laterality: Right;  . INSERTION OF MESH  10/14/2012   Procedure: INSERTION OF MESH;  Surgeon: Axel Filler, MD;  Location: [facility];  Service: General;  Laterality: Right;  . KNEE ARTHROSCOPY     right x 3  . KNEE FUSION      Family History  Problem Relation Age of Onset  . Cancer Maternal Grandfather        lung  . Alcohol abuse Maternal Grandfather     Allergies  Allergen Reactions  . Hydrocodone     Stomach upset  . Toprol Xl [Metoprolol Tartrate]     confusion    Current Outpatient Medications on File Prior to Visit  Medication Sig Dispense Refill  . busPIRone (BUSPAR) 15 MG tablet Take 1 tablet (15 mg total) by mouth 2 (two) times daily. 60 tablet 3  . hydrochlorothiazide  (MICROZIDE) 12.5 MG capsule Take 1 capsule (12.5 mg total) by mouth daily. 90 capsule 3  . lisinopril (PRINIVIL,ZESTRIL) 20 MG tablet Take 1 tablet (20 mg total) by mouth daily. 90 tablet 3  . oxyCODONE-acetaminophen (PERCOCET) 7.5-325 MG tablet Take 1 tablet by mouth every 12 (twelve) hours as needed. for pain 60 tablet 0  . potassium chloride SA (K-DUR,KLOR-CON) 20 MEQ tablet Take 1 tablet (20 mEq total) by mouth daily. 30 tablet 0  . traZODone (DESYREL) 50 MG tablet Take 0.5-1 tablets (25-50 mg total) by mouth at bedtime as needed for sleep. 30 tablet 5   No current facility-administered medications on file prior to visit.     BP (!) 152/84 (BP Location: Right Arm, Patient Position: Sitting, Cuff Size: Normal)   Pulse 76   Temp 98.2 F (36.8 C)   Resp 12   Ht 6\' 5"  (1.956 m)   Wt 204 lb (92.5 kg)   SpO2 99%   BMI 24.19 kg/m        Objective:   Physical Exam  General Mental Status- Alert. General Appearance- Not in acute distress.    Chest and Lung Exam Auscultation: Breath Sounds:-Normal.  Cardiovascular Auscultation:Rythm- Regular. Murmurs & Other Heart Sounds:Auscultation of the heart reveals- No Murmurs.  Abdomen Inspection:-Inspeection Normal. Palpation/Percussion:Note:No mass. Palpation and Percussion of the abdomen reveal- Non Tender, Non Distended + BS, no rebound or guarding.  Neurologic Cranial Nerve exam:- CN III-XII intact(No nystagmus), symmetric smile. Strength:- 5/5 equal and symmetric strength both upper and lower extremities.      Assessment & Plan:  You had very dark concentrated appearance to your urine the other day on wellness exam.  You have been hydrating well over the last 2 weeks and I want to repeat your urine microscopic examination, get urine culture and repeat metabolic panel.  Continue to decrease your caffeine intake.  Your PSA velocity was slightly increased on last visit and we will repeat a PSA today.  We will follow labs and  let you know those results. Considering might refer you to urologist.  Also decide to get acute comprehensive hepatitis panel.  Follow-up date to be determined after lab review.  Esperanza Richters, PA-C

## 2018-10-26 ENCOUNTER — Other Ambulatory Visit: Payer: Self-pay | Admitting: Medical

## 2018-10-27 NOTE — Telephone Encounter (Signed)
Rx percocet sent to pt pharmacy.

## 2018-10-27 NOTE — Telephone Encounter (Signed)
Refill Request: Percocet  Last RX:09/29/18 Last OV:10/19/18 Next TU:[facility] scheduled UDS: 09/29/18 CSC: 09/29/18 CSR:

## 2018-10-31 MED FILL — AMLODIPINE BESYLATE 10 MG T: 10 | 90 days supply | Qty: 90 | Fill #0

## 2018-10-31 MED FILL — OXYCODONE/APAP 7.5/325MG: 7.5-325 | 30 days supply | Qty: 60 | Fill #0

## 2018-11-24 ENCOUNTER — Other Ambulatory Visit: Payer: Self-pay | Admitting: Medical

## 2018-11-28 NOTE — Telephone Encounter (Signed)
Rx refill of percocet. Up to date on uds, contract and reviewed controlled med web site today

## 2018-11-29 MED FILL — OXYCODONE/APAP 7.5/325MG: 7.5-325 | 30 days supply | Qty: 60 | Fill #0

## 2018-12-26 ENCOUNTER — Encounter: Payer: Self-pay | Admitting: Medical

## 2018-12-26 ENCOUNTER — Other Ambulatory Visit: Payer: Self-pay

## 2018-12-26 ENCOUNTER — Ambulatory Visit (INDEPENDENT_AMBULATORY_CARE_PROVIDER_SITE_OTHER): Payer: Self-pay | Admitting: Medical

## 2018-12-26 VITALS — BP 178/88 | Ht 73.0 in | Wt 199.0 lb

## 2018-12-26 DIAGNOSIS — I10 Essential (primary) hypertension: Secondary | ICD-10-CM

## 2018-12-26 DIAGNOSIS — R822 Biliuria: Secondary | ICD-10-CM

## 2018-12-26 DIAGNOSIS — R972 Elevated prostate specific antigen [PSA]: Secondary | ICD-10-CM

## 2018-12-26 DIAGNOSIS — F419 Anxiety disorder, unspecified: Secondary | ICD-10-CM

## 2018-12-26 DIAGNOSIS — G4700 Insomnia, unspecified: Secondary | ICD-10-CM

## 2018-12-26 MED ORDER — TRAZODONE HCL 50 MG PO TABS: 25.0000 mg | ORAL_TABLET | Freq: Every evening | ORAL | 5 refills | Status: DC | PRN

## 2018-12-26 MED ORDER — BUSPIRONE HCL 15 MG PO TABS: 15.0000 mg | ORAL_TABLET | Freq: Two times a day (BID) | ORAL | 3 refills | Status: DC

## 2018-12-26 MED ORDER — AMLODIPINE BESYLATE 10 MG PO TABS: 10.0000 mg | ORAL_TABLET | Freq: Every day | ORAL | 1 refills | Status: DC

## 2018-12-26 MED ORDER — HYDROCHLOROTHIAZIDE 12.5 MG PO CAPS: 12.5000 mg | ORAL_CAPSULE | Freq: Every day | ORAL | 3 refills | Status: DC

## 2018-12-26 MED ORDER — OXYCODONE-ACETAMINOPHEN 7.5-325 MG PO TABS: 1.0000 | ORAL_TABLET | Freq: Two times a day (BID) | ORAL | 0 refills | Status: DC | PRN

## 2018-12-26 MED FILL — traZODone HCL 50 MG TABS: 50 | 30 days supply | Qty: 30 | Fill #0

## 2018-12-26 MED FILL — busPIRone HCL 15 MG TABS: 15 | 30 days supply | Qty: 60 | Fill #0

## 2018-12-26 MED FILL — HYDROCHLOROTHIAZIDE 12.5 MG: 12.5 | 90 days supply | Qty: 90 | Fill #0

## 2018-12-26 NOTE — Progress Notes (Signed)
Subjective:    Patient ID: Todd Jones, male    DOB: 05/18/1961, 58 y.o.   MRN: [id number]  HPI  Virtual Visit via Telephone Note  I connected with Evlyn Courier on 12/26/18 at  1:40 PM EDT by telephone and verified that I am speaking with the correct person using two identifiers.   I discussed the limitations, risks, security and privacy concerns of performing an evaluation and management service by telephone and the availability of in person appointments. I also discussed with the patient that there may be a patient responsible charge related to this service. The patient expressed understanding and agreed to proceed.  Attempt was made to do virtual visit through doxy but patient had connection issues with video and audio.  He was trying to enable both of those but it just did not work.  Not sure if he had reception issues as early in the day we tried to call him various times but he states he had the phone on him the entire time.  History of Present Illness: Pt doing telephone call follow up regarding last visit. He never did repeat studies which I wanted him to do for very concentrated urine. He states his mom died when he was in and that's why he did not repeat labs or urine.  Pt states he will get repeat studies.    Pt also needs refill of his pain medication  Pt bp is high around 170/90. 3 bp gave him todays readings.   Pt is due for refill of pain med for knees.  Pt anxiety is helped with buspar.  Pt thinks trazadone does help with his sleep. But not always.     Observations/Objective: No physical exam.  Assessment and Plan: Your blood pressure I think it is too elevated The fact that blood pressure readings close to 170/90.  So we will increase her lisinopril to twice daily and continue other BP medications.  You do have elevated PSA velocity though PSA is still in on normal range.  Still want you to repeat PSA but unfortunately we are in the viral pandemic  presently and you do not want to get test done immediately.  Will place future test to be done in 6 weeks.  Also will get metabolic panel to check your liver enzymes in light of the fact he had bilirubin on your last urine.  Also will repeat your urinalysis since it did look grossly abnormal/like dark tea per your report.  However it is good to hear that your urine is looking back to normal per your report.  For history of chronic knee pain, I did go ahead and refill your oxycodone.  You are up-to-date on your controlled medication contract and UDS.  For anxiety, refill your BuSpar.  For insomnia, I am refilling your trazodone.  Follow-up date/office visit with me to be determined after lab review.  I asked our front office staff to give you appointment for May 25 at 8 AM to get the labs done.  Please keep that appointment date.  Follow Up Instructions:    I discussed the assessment and treatment plan with the patient. The patient was provided an opportunity to ask questions and all were answered. The patient agreed with the plan and demonstrated an understanding of the instructions.   The patient was advised to call back or seek an in-person evaluation if the symptoms worsen or if the condition fails to improve as anticipated.  I provided 15 minutes  of non-face-to-face time during this encounter.   Samiksha Pellicano, PA-C   Review of Sys[facility]peranza Richterstems  Constitutional: Negative for appetite change, fatigue and fever.  Respiratory: Negative for cough, chest tightness and wheezing.   Cardiovascular: Negative for chest pain and palpitations.  Gastrointestinal: Negative for abdominal pain.  Genitourinary: Negative for discharge, dysuria, flank pain, frequency, penile pain and urgency.  Musculoskeletal:       Knee pain severe.  Skin: Negative for rash.  Neurological: Negative for dizziness, seizures, syncope, weakness, light-headedness and numbness.  Hematological: Negative for adenopathy. Does  not bruise/bleed easily.  Psychiatric/Behavioral: Positive for sleep disturbance. Negative for agitation, behavioral problems, decreased concentration and suicidal ideas. The patient is nervous/anxious.     Past Medical History:  Diagnosis Date  . Arthritis   . GERD (gastroesophageal reflux disease)   . H/O hiatal hernia   . Hypertension   . Insomnia      Social History   Socioeconomic History  . Marital status: Married    Spouse name: Not on file  . Number of children: Not on file  . Years of education: Not on file  . Highest education level: Not on file  Occupational History  . Not on file  Social Needs  . Financial resource strain: Not on file  . Food insecurity:    Worry: Not on file    Inability: Not on file  . Transportation needs:    Medical: Not on file    Non-medical: Not on file  Tobacco Use  . Smoking status: Former Games developermoker  . Smokeless tobacco: Never Used  Substance and Sexual Activity  . Alcohol use: No  . Drug use: Yes    Types: Marijuana  . Sexual activity: Not on file  Lifestyle  . Physical activity:    Days per week: Not on file    Minutes per session: Not on file  . Stress: Not on file  Relationships  . Social connections:    Talks on phone: Not on file    Gets together: Not on file    Attends religious service: Not on file    Active member of club or organization: Not on file    Attends meetings of clubs or organizations: Not on file    Relationship status: Not on file  . Intimate partner violence:    Fear of current or ex partner: Not on file    Emotionally abused: Not on file    Physically abused: Not on file    Forced sexual activity: Not on file  Other Topics Concern  . Not on file  Social History Narrative  . Not on file    Past Surgical History:  Procedure Laterality Date  . CHOLECYSTECTOMY    . INGUINAL HERNIA REPAIR  10/14/2012   Procedure: LAPAROSCOPIC INGUINAL HERNIA;  Surgeon: Axel FillerArmando Ramirez, MD;  Location: [facility];   Service: General;  Laterality: Right;  . INSERTION OF MESH  10/14/2012   Procedure: INSERTION OF MESH;  Surgeon: Axel FillerArmando Ramirez, MD;  Location: [facility];  Service: General;  Laterality: Right;  . KNEE ARTHROSCOPY     right x 3  . KNEE FUSION      Family History  Problem Relation Age of Onset  . Cancer Maternal Grandfather        lung  . Alcohol abuse Maternal Grandfather     Allergies  Allergen Reactions  . Hydrocodone     Stomach upset  . Toprol Xl [Metoprolol Tartrate]     confusion  Current Outpatient Medications on File Prior to Visit  Medication Sig Dispense Refill  . lisinopril (PRINIVIL,ZESTRIL) 20 MG tablet Take 1 tablet (20 mg total) by mouth daily. 90 tablet 3  . potassium chloride SA (K-DUR,KLOR-CON) 20 MEQ tablet Take 1 tablet (20 mEq total) by mouth daily. 30 tablet 0   No current facility-administered medications on file prior to visit.     BP (!) 178/88   Ht  (1.854 m)   Wt 199 lb (90.3 kg)   BMI 26.25 kg/m       Objective:   Physical Exam  None.      Assessment & Plan:

## 2018-12-26 NOTE — Patient Instructions (Addendum)
Your blood pressure I think it is too elevated The fact that blood pressure readings close to 170/90.  So we will increase her lisinopril to twice daily and continue other BP medications.  You do have elevated PSA velocity though PSA is still in on normal range.  Still want you to repeat PSA but unfortunately we are in the viral pandemic presently and you do not want to get test done immediately.  Will place future test to be done in 6 weeks.  Also will get metabolic panel to check your liver enzymes in light of the fact he had bilirubin on your last urine.  Also will repeat your urinalysis since it did look grossly abnormal/like dark tea per your report.  However it is good to hear that your urine is looking back to normal per your report.  For history of chronic knee pain, I did go ahead and refill your oxycodone.  You are up-to-date on your controlled medication contract and UDS.  For anxiety, refill your BuSpar.  For insomnia, I am refilling your trazodone.  Follow-up date/office visit with me to be determined after lab review.  I asked our front office staff to give you appointment for May 25 at 8 AM to get the labs done.  Please keep that appointment date.

## 2018-12-26 NOTE — Progress Notes (Signed)
   Subjective:    Patient ID: Todd Jones, male    DOB: 10/05/1960, 58 y.o.   MRN: [id number]  HPI  So him later in the day via phone.   Review of Systems     Objective:   Physical Exam        Assessment & Plan:

## 2018-12-28 MED FILL — OXYCODONE/APAP 7.5/325MG: 7.5-325 | 30 days supply | Qty: 60 | Fill #0

## 2018-12-29 ENCOUNTER — Ambulatory Visit: Payer: Self-pay | Admitting: Medical

## 2019-01-03 MED FILL — LISINOPRIL 20 MG TABLET: 20 | 90 days supply | Qty: 90 | Fill #1

## 2019-01-24 ENCOUNTER — Other Ambulatory Visit: Payer: Self-pay | Admitting: Medical

## 2019-01-24 MED FILL — AMLODIPINE BESYLATE 10 MG T: 10 | 90 days supply | Qty: 90 | Fill #1

## 2019-01-24 NOTE — Telephone Encounter (Signed)
Refilled oxycodone rx.

## 2019-01-26 MED FILL — OXYCODONE/APAP 7.5/325MG: 7.5-325 | 30 days supply | Qty: 60 | Fill #0

## 2019-02-07 ENCOUNTER — Other Ambulatory Visit: Payer: Self-pay

## 2019-02-07 ENCOUNTER — Telehealth: Payer: Self-pay | Admitting: Medical

## 2019-02-07 DIAGNOSIS — R739 Hyperglycemia, unspecified: Secondary | ICD-10-CM

## 2019-02-07 DIAGNOSIS — I10 Essential (primary) hypertension: Secondary | ICD-10-CM

## 2019-02-07 DIAGNOSIS — R822 Biliuria: Secondary | ICD-10-CM

## 2019-02-07 DIAGNOSIS — E786 Lipoprotein deficiency: Secondary | ICD-10-CM

## 2019-02-07 DIAGNOSIS — R972 Elevated prostate specific antigen [PSA]: Secondary | ICD-10-CM

## 2019-02-07 NOTE — Telephone Encounter (Signed)
Future labs placed. Please get pt scheduled for those labs.

## 2019-02-22 NOTE — Progress Notes (Signed)
Virtual Visit via Telephone Note  I connected with Todd Jones on 02/22/19 at  8:40 AM EDT by telephone and verified that I am speaking with the correct person using two identifiers.  Location: Patient: home  Provider: office  Pt bp cuff is not working per his report. Giving very erratic readings other day and screen.   I discussed the limitations, risks, security and privacy concerns of performing an evaluation and management service by telephone and the availability of in person appointments. I also discussed with the patient that there may be a patient responsible charge related to this service. The patient expressed understanding and agreed to proceed.   History of Present Illness: Pt has follow up today for chronic problems.  Pt has elevated psa in past. Not real high but increase prostate velocity. I put in labs for him to get but he has not gotten this and other labs yet.  Also he had very concentrated urine appearance with urobilinogen and leukocytes. I wanted him to get repeat ua and cmp but that has not been does as of yet.  For htn I wanted him to get lipid panel to assess risk factors.  Pt has chronic pain in both knees with severe osteoarthritis. Pt has avoid surgery due to cost. He has no insurance. He is on percocet. This does control his pain. He follows through with uds and contact.   For anxiety he is on buspar. Controlls his anxiety.  Pt states last week he fell cleaning. He landed and shoulder and is convinced was dislocated. He states he reset the shoulder. He had 3-4 days and now pain is subsiding. He states he had good range of motion now. Pt states no history of dislocation in the past. Pt did not go to ED.   Observations/Objective:  General- no acute distress. Pleasant. Oriented. Normal speech.  Assessment and Plan:   For high blood pressure continue current bp medication.   For low hdl an hx of htn want to repeat lipid panel and ask you come in  fasting.  For increased psa velocity want you to repeat psa when you come in for labs.  For hx of abnormal urine study please hydrate well on day that you come in for lab.  For knee pain continue with percocet.   For anxiety continue with buspar.  Please get future labs that I have already placed.  Pt shoulder is not hurting presently and good range of motion. If pain returns then will get x-ray or refer to sports medicine.   Follow up early October or as needed(appoitment may be virtual or in office)  Follow Up Instructions:    I discussed the assessment and treatment plan with the patient. The patient was provided an opportunity to ask questions and all were answered. The patient agreed with the plan and demonstrated an understanding of the instructions.   The patient was advised to call back or seek an in-person evaluation if the symptoms worsen or if the condition fails to improve as anticipated.  I provided 15 minutes of non-face-to-face time during this encounter.   Mackie Pai, PA-C

## 2019-02-22 NOTE — Patient Instructions (Addendum)
For high blood pressure continue current bp medication.   For low hdl an hx of htn want to repeat lipid panel and ask you come in fasting.  For increased psa velocity want you to repeat psa when you come in for labs.  For hx of abnormal urine study please hydrate well on day that you come in for lab.  For knee pain continue with percocet.   For anxiety continue with buspar.  Please get future labs that I have already placed.  Pt shoulder is not hurting presently and good range of motion. If pain returns then will get x-ray or refer to sports medicine.  Follow up early October or as needed(appoitment may be virtual or in office)

## 2019-02-23 ENCOUNTER — Other Ambulatory Visit: Payer: Self-pay

## 2019-02-23 ENCOUNTER — Encounter: Payer: Self-pay | Admitting: Medical

## 2019-02-23 ENCOUNTER — Ambulatory Visit (INDEPENDENT_AMBULATORY_CARE_PROVIDER_SITE_OTHER): Payer: Self-pay | Admitting: Medical

## 2019-02-23 ENCOUNTER — Telehealth: Payer: Self-pay | Admitting: Medical

## 2019-02-23 ENCOUNTER — Other Ambulatory Visit (INDEPENDENT_AMBULATORY_CARE_PROVIDER_SITE_OTHER): Payer: Self-pay

## 2019-02-23 DIAGNOSIS — E786 Lipoprotein deficiency: Secondary | ICD-10-CM

## 2019-02-23 DIAGNOSIS — R972 Elevated prostate specific antigen [PSA]: Secondary | ICD-10-CM

## 2019-02-23 DIAGNOSIS — F419 Anxiety disorder, unspecified: Secondary | ICD-10-CM

## 2019-02-23 DIAGNOSIS — R739 Hyperglycemia, unspecified: Secondary | ICD-10-CM

## 2019-02-23 DIAGNOSIS — R822 Biliuria: Secondary | ICD-10-CM

## 2019-02-23 DIAGNOSIS — I10 Essential (primary) hypertension: Secondary | ICD-10-CM

## 2019-02-23 LAB — LIPID PANEL
Cholesterol: 191 mg/dL (ref 0–200)
HDL: 42.2 mg/dL (ref >39.00)
LDL Cholesterol: 120 mg/dL — ABNORMAL HIGH (ref 0–99)
NonHDL: 148.5
Total CHOL/HDL Ratio: 5
Triglycerides: 143 mg/dL (ref 0.0–149.0)
VLDL: 28.6 mg/dL (ref 0.0–40.0)

## 2019-02-23 LAB — COMPREHENSIVE METABOLIC PANEL
ALT: 20 U/L (ref 0–53)
AST: 17 U/L (ref 0–37)
Albumin: 4.5 g/dL (ref 3.5–5.2)
Alkaline Phosphatase: 49 U/L (ref 39–117)
BUN: 11 mg/dL (ref 6–23)
CO2: 24 mEq/L (ref 19–32)
Calcium: 9.2 mg/dL (ref 8.4–10.5)
Chloride: 106 mEq/L (ref 96–112)
Creatinine, Ser: 0.84 mg/dL (ref 0.40–1.50)
GFR: 93.34 mL/min (ref >60.00)
Glucose, Bld: 90 mg/dL (ref 70–99)
Potassium: 3.7 mEq/L (ref 3.5–5.1)
Sodium: 141 mEq/L (ref 135–145)
Total Bilirubin: 0.4 mg/dL (ref 0.2–1.2)
Total Protein: 6.4 g/dL (ref 6.0–8.3)

## 2019-02-23 LAB — POC URINALSYSI DIPSTICK (AUTOMATED)
Bilirubin, UA: NEGATIVE
Blood, UA: NEGATIVE
Glucose, UA: NEGATIVE
Leukocytes, UA: NEGATIVE
Nitrite, UA: NEGATIVE
Protein, UA: NEGATIVE
Spec Grav, UA: 1.01 (ref 1.010–1.025)
Urobilinogen, UA: 0.2 E.U./dL
pH, UA: 6 (ref 5.0–8.0)

## 2019-02-23 LAB — HEMOGLOBIN A1C: Hgb A1c MFr Bld: 5.5 % (ref 4.6–6.5)

## 2019-02-23 LAB — PSA: PSA: 0.84 ng/mL (ref 0.10–4.00)

## 2019-02-23 MED ORDER — TRAZODONE HCL 50 MG PO TABS: 25.0000 mg | ORAL_TABLET | Freq: Every evening | ORAL | 5 refills | Status: AC | PRN

## 2019-02-23 MED ORDER — OXYCODONE-ACETAMINOPHEN 7.5-325 MG PO TABS: 1.0000 | ORAL_TABLET | Freq: Two times a day (BID) | ORAL | 0 refills | Status: DC | PRN

## 2019-02-23 MED ORDER — SIMVASTATIN 20 MG PO TABS: 20.0000 mg | ORAL_TABLET | Freq: Every day | ORAL | 3 refills | Status: DC

## 2019-02-23 MED FILL — traZODone HCL 50 MG TABS: 50 | 30 days supply | Qty: 30 | Fill #0

## 2019-02-23 NOTE — Telephone Encounter (Signed)
Rx simvastatin sent to pt pharmacy. 

## 2019-02-24 ENCOUNTER — Telehealth: Payer: Self-pay | Admitting: Medical

## 2019-02-24 DIAGNOSIS — R82998 Other abnormal findings in urine: Secondary | ICD-10-CM

## 2019-02-24 MED FILL — OXYCODONE/APAP 7.5/325MG: 7.5-325 | 30 days supply | Qty: 60 | Fill #0

## 2019-02-24 MED FILL — SIMVASTATIN 20 MG TABLET: 20 | 30 days supply | Qty: 30 | Fill #0

## 2019-02-24 NOTE — Telephone Encounter (Signed)
Future culture urine placed.

## 2019-03-21 ENCOUNTER — Other Ambulatory Visit: Payer: Self-pay | Admitting: Medical

## 2019-03-21 MED FILL — HYDROCHLOROTHIAZIDE 12.5 MG: 12.5 | 90 days supply | Qty: 90 | Fill #1

## 2019-03-21 MED FILL — SIMVASTATIN 20 MG TABLET: 20 | 30 days supply | Qty: 30 | Fill #1

## 2019-03-21 MED FILL — LISINOPRIL 20 MG TABLET: 20 | 90 days supply | Qty: 90 | Fill #2

## 2019-03-23 NOTE — Telephone Encounter (Addendum)
Refill request: Oxycodone   Last RX:02/23/19 Last OV:02/23/19 Next OV-October. UDS:09/29/2018 CSC:09/29/18   Please schedule pt for follow up in October.

## 2019-03-24 MED FILL — OXYCODONE/APAP 7.5/325MG: 7.5-325 | 30 days supply | Qty: 60 | Fill #0

## 2019-04-19 ENCOUNTER — Other Ambulatory Visit: Payer: Self-pay | Admitting: Medical

## 2019-04-20 NOTE — Telephone Encounter (Signed)
Refill Request: Percocet  Last RX:03/23/19 Last OV:02/23/19 Next OV:06/26/19 UDS:09/29/18 CSC:09/29/18 CSR:

## 2019-04-21 MED FILL — OXYCODONE/APAP 7.5/325MG: 7.5-325 | 30 days supply | Qty: 60 | Fill #0

## 2019-05-01 ENCOUNTER — Ambulatory Visit (INDEPENDENT_AMBULATORY_CARE_PROVIDER_SITE_OTHER): Payer: Self-pay | Admitting: Medical

## 2019-05-01 ENCOUNTER — Other Ambulatory Visit: Payer: Self-pay

## 2019-05-01 ENCOUNTER — Encounter: Payer: Self-pay | Admitting: Medical

## 2019-05-01 DIAGNOSIS — M25562 Pain in left knee: Secondary | ICD-10-CM

## 2019-05-01 DIAGNOSIS — G8929 Other chronic pain: Secondary | ICD-10-CM

## 2019-05-01 DIAGNOSIS — G4700 Insomnia, unspecified: Secondary | ICD-10-CM

## 2019-05-01 DIAGNOSIS — M25561 Pain in right knee: Secondary | ICD-10-CM

## 2019-05-01 DIAGNOSIS — F411 Generalized anxiety disorder: Secondary | ICD-10-CM

## 2019-05-01 MED ORDER — DIAZEPAM 5 MG PO TABS: ORAL_TABLET | ORAL | 0 refills | Status: DC

## 2019-05-01 MED FILL — diazePAM 5 MG TABS: 5 | 8 days supply | Qty: 8 | Fill #0

## 2019-05-01 NOTE — Patient Instructions (Addendum)
Patient in today with recent insomnia as well as some anxiety.  He describes racing thoughts constantly at night and for various consecutive days in a row was unable to sleep at all.  He states it was so severe that he began to question effective this insomnia on his mental health.  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  I had switched him to combination of trazodone and BuSpar in the past.  This combination seemed to help him about 50% of the time but then recently it did not help at all.  Counseled patient on risk and potential side effects combination use of benzodiazepine and narcotics.  Explained that if I do prescribe low-dose Valium for limited number of time would only give 8 tablets.  Encouraged him just to use that approximately every third night so he can get occasional sound sleep.  If he does have plan to use Valium at night then not to use second tablet of Percocet during the day.  Patient expressed understanding.  Patient is going to discontinue BuSpar and trazodone presently.  Follow-up in approximate mid setpember to early October.  Explained to patient importance of getting a flu vaccine this year.

## 2019-05-01 NOTE — Progress Notes (Signed)
Subjective:    Patient ID: Todd Jones, male    DOB: 06-24-1961, 58 y.o.   MRN: [id number]  HPI  Virtual Visit via Video Note  I connected with Foye Spurling on 05/01/19 at  2:20 PM EDT by a video enabled telemedicine application and verified that I am speaking with the correct person using two identifiers.  Location: Patient: home Provider: home     I discussed the limitations of evaluation and management by telemedicine and the availability of in person appointments. The patient expressed understanding and agreed to proceed.  Pt did not check vitals today.  History of Present Illness:  Pt states he has not been sleeping well over past 2 weeks. Pt states he was basically not able to sleep at all. He states trazadone was helping about 1/2 of the nights. But then last 2 weeks trazadone was not helping at all. Pt eventually had to borrow xanax from a friend to get some sleep otherwise he states he felt like he was going crazy. In past I had tried [place] and he states it did not help. He states it made him feel weird. He states he feels like his mind won't shut off.   Pt also states his back is hurting but gradually getting better.  He also has bone on bone knee pain for which he uses percocet.   Pt is beginning to file through process of disability.  Pt wants me to write letter stating he is ok to drive.      Observations/Objective: General- no acute distress, pleasant. Oriented. Normal speech.    Assessment and Plan: Patient in today with recent insomnia as well as some anxiety.  He describes racing thoughts constantly at night and for various consecutive days in a row was unable to sleep at all.  He states it was so severe that he began to question effective this insomnia on his mental health.  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  I had switched  him to combination of trazodone and BuSpar in the past.  This combination seemed to help him about 50% of the time but then recently it did not help at all.  Counseled patient on risk and potential side effects combination use of benzodiazepine and narcotics.  Explained that if I do prescribe low-dose Valium for limited number of time would only give 8 tablets.  Encouraged him just to use that approximately every third night so he can get occasional sound sleep.  If he does have plan to use Valium at night then not to use second tablet of Percocet during the day.  Patient expressed understanding.  Patient is going to discontinue BuSpar and trazodone presently.  Follow-up in approximate mid setpember to early October.  Explained to patient importance of getting a flu vaccine this year.  Mackie Pai, PA-C  Follow Up Instructions:    I discussed the assessment and treatment plan with the patient. The patient was provided an opportunity to ask questions and all were answered. The patient agreed with the plan and demonstrated an understanding of the instructions.   The patient was advised to call back or seek an in-person evaluation if the symptoms worsen or if the condition fails to improve as anticipated.  I provided 25 minutes of non-face-to-face time during this encounter.  50% of time spent counseling patient on  plan going forward for his insomnia and anxiety.  Explained in depth risk and benefits of medication regimen.  Answered patient's questions as well.   Esperanza RichtersEdward Margart Zemanek, PA-C   Review of Systems  Constitutional: Negative for chills, fatigue and fever.  HENT: Negative for dental problem and drooling.   Respiratory: Negative for cough, chest tightness, shortness of breath, wheezing and stridor.   Cardiovascular: Negative for chest pain and palpitations.  Gastrointestinal: Negative for abdominal pain.  Musculoskeletal: Negative for arthralgias and gait problem.  Skin: Negative for  rash.  Hematological: Negative for adenopathy. Does not bruise/bleed easily.  Psychiatric/Behavioral: Positive for sleep disturbance. Negative for behavioral problems and dysphoric mood. The patient is nervous/anxious.     Past Medical History:  Diagnosis Date   Arthritis    GERD (gastroesophageal reflux disease)    H/O hiatal hernia    Hypertension    Insomnia      Social History   Socioeconomic History   Marital status: Married    Spouse name: Not on file   Number of children: Not on file   Years of education: Not on file   Highest education level: Not on file  Occupational History   Not on file  Social Needs   Financial resource strain: Not on file   Food insecurity    Worry: Not on file    Inability: Not on file   Transportation needs    Medical: Not on file    Non-medical: Not on file  Tobacco Use   Smoking status: Former Smoker   Smokeless tobacco: Never Used  Substance and Sexual Activity   Alcohol use: No   Drug use: Yes    Types: Marijuana   Sexual activity: Not on file  Lifestyle   Physical activity    Days per week: Not on file    Minutes per session: Not on file   Stress: Not on file  Relationships   Social connections    Talks on phone: Not on file    Gets together: Not on file    Attends religious service: Not on file    Active member of club or organization: Not on file    Attends meetings of clubs or organizations: Not on file    Relationship status: Not on file   Intimate partner violence    Fear of current or ex partner: Not on file    Emotionally abused: Not on file    Physically abused: Not on file    Forced sexual activity: Not on file  Other Topics Concern   Not on file  Social History Narrative   Not on file    Past Surgical History:  Procedure Laterality Date   CHOLECYSTECTOMY     INGUINAL HERNIA REPAIR  10/14/2012   Procedure: LAPAROSCOPIC INGUINAL HERNIA;  Surgeon: Axel FillerArmando Ramirez, MD;  Location: [facility];  Service: General;  Laterality: Right;   INSERTION OF MESH  10/14/2012   Procedure: INSERTION OF MESH;  Surgeon: Axel FillerArmando Ramirez, MD;  Location: [facility];  Service: General;  Laterality: Right;   KNEE ARTHROSCOPY     right x 3   KNEE FUSION      Family History  Problem Relation Age of Onset   Cancer Maternal Grandfather        lung   Alcohol abuse Maternal Grandfather     Allergies  Allergen Reactions   Hydrocodone     Stomach upset   Toprol Xl [Metoprolol Tartrate]     confusion  Current Outpatient Medications on File Prior to Visit  Medication Sig Dispense Refill   amLODipine (NORVASC) 10 MG tablet Take 1 tablet (10 mg total) by mouth daily. 90 tablet 1   hydrochlorothiazide (MICROZIDE) 12.5 MG capsule Take 1 capsule (12.5 mg total) by mouth daily. 90 capsule 3   lisinopril (PRINIVIL,ZESTRIL) 20 MG tablet Take 1 tablet (20 mg total) by mouth daily. 90 tablet 3   oxyCODONE-acetaminophen (PERCOCET) 7.5-325 MG tablet TAKE 1 TABLET BY MOUTH EVERY 12 HOURS AS NEEDED FOR PAIN 60 tablet 0   simvastatin (ZOCOR) 20 MG tablet Take 1 tablet (20 mg total) by mouth at bedtime. 30 tablet 3   traZODone (DESYREL) 50 MG tablet Take 0.5-1 tablets (25-50 mg total) by mouth at bedtime as needed for sleep. 30 tablet 5   No current facility-administered medications on file prior to visit.     There were no vitals taken for this visit.      Objective:   Physical Exam        Assessment & Plan:

## 2019-05-18 ENCOUNTER — Other Ambulatory Visit: Payer: Self-pay | Admitting: Family Medicine

## 2019-05-18 MED FILL — AMLODIPINE BESYLATE 10 MG T: 10 | 90 days supply | Qty: 90 | Fill #0

## 2019-05-18 MED FILL — SIMVASTATIN 20 MG TABLET: 20 | 30 days supply | Qty: 30 | Fill #2

## 2019-05-19 NOTE — Telephone Encounter (Addendum)
Requesting:Oxycodone  Contract:yes EXM:[id number] risk  Last OV:05/01/19 Next OV:06/26/19 Last Refill:04/20/19  #[id number] Database:05/22/2019.   Please advise

## 2019-05-22 NOTE — Telephone Encounter (Signed)
Refilled his oxycodone. Note very limited occasional valium rx given. Pt aware not to use both together. Last rx valium only 8 tabs.   Wanted to let you know rx sent in.  Thanks for helping.

## 2019-05-23 MED FILL — OXYCODONE/APAP 7.5/325MG: 7.5-325 | 30 days supply | Qty: 60 | Fill #0

## 2019-06-05 ENCOUNTER — Telehealth: Payer: Self-pay | Admitting: Medical

## 2019-06-05 DIAGNOSIS — I10 Essential (primary) hypertension: Secondary | ICD-10-CM

## 2019-06-05 DIAGNOSIS — R739 Hyperglycemia, unspecified: Secondary | ICD-10-CM

## 2019-06-05 DIAGNOSIS — E785 Hyperlipidemia, unspecified: Secondary | ICD-10-CM

## 2019-06-05 MED ORDER — LISINOPRIL 20 MG PO TABS: ORAL_TABLET | ORAL | 3 refills | Status: DC

## 2019-06-05 NOTE — Telephone Encounter (Signed)
Pt states Edward had chnged his dose of lisinopril (PRINIVIL,ZESTRIL) 20 MG tablet  And told him to start taking 2 a day.   Now pt is out, and needs new Rx with new dose sent to  [facility], [place] [phone] (Phone) [phone] (Fax)   Pt states he now takes 1 in am and 1 at bedtime

## 2019-06-05 NOTE — Telephone Encounter (Signed)
Let pt know that will rx refill of lisinopril 20 mg twice daily. But also want him to get fasting labs this month. Will you get him scheduled.  Important. I want to make sure kidney can tolerate taking lisnipril twice daily/max dose.

## 2019-06-05 NOTE — Telephone Encounter (Signed)
I don't see where is say to take two tablets daily. Please advise.

## 2019-06-06 MED FILL — LISINOPRIL 20 MG TABS: 20 | 90 days supply | Qty: 180 | Fill #0

## 2019-06-06 NOTE — Telephone Encounter (Signed)
Left pt a message to call back to schedule lab appointment.  

## 2019-06-12 NOTE — Telephone Encounter (Signed)
Lab appt scheduled for 10.6.2020

## 2019-06-16 ENCOUNTER — Other Ambulatory Visit: Payer: Self-pay | Admitting: Medical

## 2019-06-16 MED FILL — SIMVASTATIN 20 MG TABLET: 20 | 30 days supply | Qty: 30 | Fill #3

## 2019-06-16 NOTE — Telephone Encounter (Signed)
Refill Request: Percocet   Last RX:05/22/19 Last OV:05/01/19 Next OV:06/26/19 UDS:09/29/18 CSC:09/29/18 CSR:

## 2019-06-20 ENCOUNTER — Other Ambulatory Visit (INDEPENDENT_AMBULATORY_CARE_PROVIDER_SITE_OTHER): Payer: Self-pay

## 2019-06-20 ENCOUNTER — Other Ambulatory Visit: Payer: Self-pay

## 2019-06-20 DIAGNOSIS — R82998 Other abnormal findings in urine: Secondary | ICD-10-CM

## 2019-06-20 DIAGNOSIS — I10 Essential (primary) hypertension: Secondary | ICD-10-CM

## 2019-06-20 DIAGNOSIS — R739 Hyperglycemia, unspecified: Secondary | ICD-10-CM

## 2019-06-20 LAB — LIPID PANEL
Cholesterol: 140 mg/dL (ref 0–200)
HDL: 42 mg/dL (ref >39.00)
NonHDL: 97.86
Total CHOL/HDL Ratio: 3
Triglycerides: 239 mg/dL — ABNORMAL HIGH (ref 0.0–149.0)
VLDL: 47.8 mg/dL — ABNORMAL HIGH (ref 0.0–40.0)

## 2019-06-20 LAB — COMPREHENSIVE METABOLIC PANEL
ALT: 17 U/L (ref 0–53)
AST: 15 U/L (ref 0–37)
Albumin: 4.3 g/dL (ref 3.5–5.2)
Alkaline Phosphatase: 47 U/L (ref 39–117)
BUN: 20 mg/dL (ref 6–23)
CO2: 29 mEq/L (ref 19–32)
Calcium: 9.3 mg/dL (ref 8.4–10.5)
Chloride: 103 mEq/L (ref 96–112)
Creatinine, Ser: 0.93 mg/dL (ref 0.40–1.50)
GFR: 82.9 mL/min (ref >60.00)
Glucose, Bld: 108 mg/dL — ABNORMAL HIGH (ref 70–99)
Potassium: 4.3 mEq/L (ref 3.5–5.1)
Sodium: 139 mEq/L (ref 135–145)
Total Bilirubin: 0.3 mg/dL (ref 0.2–1.2)
Total Protein: 6.2 g/dL (ref 6.0–8.3)

## 2019-06-20 LAB — LDL CHOLESTEROL, DIRECT: Direct LDL: 72 mg/dL

## 2019-06-20 LAB — HEMOGLOBIN A1C: Hgb A1c MFr Bld: 5.4 % (ref 4.6–6.5)

## 2019-06-20 MED FILL — OXYCODONE/APAP 7.5/325MG: 7.5-325 | 30 days supply | Qty: 60 | Fill #0

## 2019-06-22 LAB — URINE CULTURE
MICRO NUMBER:: 959158
SPECIMEN QUALITY:: ADEQUATE

## 2019-06-26 ENCOUNTER — Ambulatory Visit: Payer: Self-pay | Admitting: Medical

## 2019-06-26 DIAGNOSIS — Z0289 Encounter for other administrative examinations: Secondary | ICD-10-CM

## 2019-07-19 ENCOUNTER — Other Ambulatory Visit: Payer: Self-pay | Admitting: Medical

## 2019-07-19 ENCOUNTER — Other Ambulatory Visit: Payer: Self-pay | Admitting: Family Medicine

## 2019-07-19 MED FILL — HYDROCHLOROTHIAZIDE 12.5 MG: 12.5 | 90 days supply | Qty: 90 | Fill #2

## 2019-07-19 MED FILL — SIMVASTATIN 20 MG TABLET: 20 | 90 days supply | Qty: 90 | Fill #0

## 2019-07-19 NOTE — Telephone Encounter (Addendum)
Refill Request: Percocet   Last RX: 06/18/19 Last OV:05/02/19 Next OV: None scheduled  UDS:09/19/18 CSC:09/19/18 CSR:07/20/2019  Refilled medication today.

## 2019-07-20 MED FILL — OXYCODONE/APAP 7.5/325MG: 7.5-325 | 30 days supply | Qty: 60 | Fill #0

## 2019-08-17 ENCOUNTER — Other Ambulatory Visit: Payer: Self-pay | Admitting: Medical

## 2019-08-17 MED FILL — OXYCODONE/APAP 7.5/325MG: 7.5-325 | 30 days supply | Qty: 60 | Fill #0

## 2019-08-17 NOTE — Telephone Encounter (Signed)
Refill Request: Percocet  Last RX:07/20/19 Last OV:05/01/19 Next OV: UDS:09/29/18 CSC:09/29/18 CSR:

## 2019-09-12 ENCOUNTER — Telehealth: Payer: Self-pay

## 2019-09-12 NOTE — Telephone Encounter (Signed)
January 4th ok.

## 2019-09-12 NOTE — Telephone Encounter (Signed)
Copied from [place] [id number]. Topic: General - Other >> Sep 12, 2019 11:20 AM Keene Breath wrote: Reason for CRM: Patient called to get an update on how he should be scheduling his appts.  Please call him at [phone]

## 2019-09-12 NOTE — Telephone Encounter (Signed)
Patient wanted to be scheduled for a follow up after January 1st. Patient stated he usually sees Percell Miller every 3 months but due to Covid has been pushed back. Scheduled patient for 09/18/19

## 2019-09-18 ENCOUNTER — Other Ambulatory Visit: Payer: Self-pay

## 2019-09-18 ENCOUNTER — Ambulatory Visit (INDEPENDENT_AMBULATORY_CARE_PROVIDER_SITE_OTHER): Payer: Self-pay | Admitting: Medical

## 2019-09-18 ENCOUNTER — Encounter: Payer: Self-pay | Admitting: Medical

## 2019-09-18 ENCOUNTER — Telehealth: Payer: Self-pay | Admitting: Medical

## 2019-09-18 ENCOUNTER — Other Ambulatory Visit: Payer: Self-pay | Admitting: *Deleted

## 2019-09-18 ENCOUNTER — Other Ambulatory Visit: Payer: Self-pay | Admitting: Medical

## 2019-09-18 VITALS — BP 162/77 | Temp 98.1°F | Ht 73.0 in | Wt 210.0 lb

## 2019-09-18 DIAGNOSIS — R739 Hyperglycemia, unspecified: Secondary | ICD-10-CM

## 2019-09-18 DIAGNOSIS — G8929 Other chronic pain: Secondary | ICD-10-CM

## 2019-09-18 DIAGNOSIS — M25561 Pain in right knee: Secondary | ICD-10-CM

## 2019-09-18 DIAGNOSIS — I10 Essential (primary) hypertension: Secondary | ICD-10-CM

## 2019-09-18 DIAGNOSIS — E785 Hyperlipidemia, unspecified: Secondary | ICD-10-CM

## 2019-09-18 DIAGNOSIS — M25562 Pain in left knee: Secondary | ICD-10-CM

## 2019-09-18 DIAGNOSIS — G4700 Insomnia, unspecified: Secondary | ICD-10-CM

## 2019-09-18 MED ORDER — LISINOPRIL 40 MG PO TABS: 40.0000 mg | ORAL_TABLET | Freq: Every day | ORAL | 3 refills | Status: DC

## 2019-09-18 MED ORDER — AMLODIPINE BESYLATE 10 MG PO TABS: 10.0000 mg | ORAL_TABLET | Freq: Every day | ORAL | 3 refills | Status: DC

## 2019-09-18 MED ORDER — OXYCODONE-ACETAMINOPHEN 7.5-325 MG PO TABS: 1.0000 | ORAL_TABLET | Freq: Two times a day (BID) | ORAL | 0 refills | Status: DC | PRN

## 2019-09-18 MED ORDER — DIAZEPAM 5 MG PO TABS: ORAL_TABLET | ORAL | 0 refills | Status: DC

## 2019-09-18 MED FILL — diazePAM 5 MG TABS: 5 | 8 days supply | Qty: 8 | Fill #0

## 2019-09-18 MED FILL — AMLODIPINE BESYLATE 10 MG T: 10 | 90 days supply | Qty: 90 | Fill #0

## 2019-09-18 MED FILL — OXYCODONE-APAP 7.5-325MG: 7.5-325 | 30 days supply | Qty: 60 | Fill #0

## 2019-09-18 MED FILL — LISINOPRIL 40 MG TABLET: 40 | 90 days supply | Qty: 90 | Fill #0

## 2019-09-18 NOTE — Telephone Encounter (Signed)
Pt being scheduled for uds and renew contract. He won't see me that day. But courteney is getting him scheduled. So want you to be aware.

## 2019-09-18 NOTE — Telephone Encounter (Signed)
Patient stated he was seen earlier but forgot to ask for refill on diazepam and oxycodone.

## 2019-09-18 NOTE — Patient Instructions (Addendum)
For htn, increased lisinopril to 40 mg q day.  Continue amlodipine. Check bp twice weekly and update me on bp readings in 2 weeks.  For high triglycerides recommend low fat diet, and  decreased processed carbohydrates.  For elevated sugar continue low sugar diet.  For insomnia will make limited number of valium available for you to use each month. Reminder not to use with oxycodone.  For chronic knee pain, refill oxycodone. Hopefully you will get insurance and disability. Then consider knee replacements.  Asking staff to call you and get you scheduled for uds and update on contract.  Follow up date 4-8 months or as needed

## 2019-09-18 NOTE — Addendum Note (Signed)
Addended by: Gwenevere Abbot on: 09/18/2019 01:08 PM   Modules accepted: Orders

## 2019-09-18 NOTE — Telephone Encounter (Signed)
Requesting:Oxycodone Contract:11/25/2017 UDS:yes Last OV:09/18/2019 Next OV:n/a Last Refill: 08/17/19 #60tab Database:   Please advise

## 2019-09-18 NOTE — Progress Notes (Signed)
Subjective:    Patient ID: Todd Jones, male    DOB: November 12, 1960, 59 y.o.   MRN: [id number]  HPI   Virtual Visit via Telephone Note  I connected with Todd Jones on 09/18/19 at  9:00 AM EST by telephone and verified that I am speaking with the correct person using two identifiers.  Location: Patient: home Provider: office   I discussed the limitations, risks, security and privacy concerns of performing an evaluation and management service by telephone and the availability of in person appointments. I also discussed with the patient that there may be a patient responsible charge related to this service. The patient expressed understanding and agreed to proceed.   History of Present Illness: Pt has htn. High bp in past. Pt states his systolic is usually 150-60 range and diastolic 70-80 range.  Pt has been taking meds. No cardiac or neurologic signs symptoms.  Mild elevated sugar in past but a1c was not in diabetic range. He has cut back on sugar since that lab result.  Pt has triglycerides in past but other lipid markers looked good.  Pt has history of insomnia. He states recently has been sleeping well. He states he is mostly retired now. In past trazadone did not help. Rare [place] of valium. He only wants 10 tabs per month.   Pt has chronic knee pain and low back pain. Recent flare of back pain after helping son move. He ran out of meds early and tolerated being off meds. Pt has severe crepitus in past and told needs knee replacement. Pt still has no insurance. When he gets insurance will consider knee replacement. He is working on getting disability and then might reconsider.      Observations/Objective: General- no acute distress, pleasant, oriented, pleasant and normal speech.    Assessment and Plan: For htn, increased lisinopril to 40 mg q day.  Continue amlodipine. Check bp twice weekly and update me on bp readings in 2 weeks.  For high triglycerides recommend low  fat diet, and  decreased processed carbohydrates.  For elevated sugar continue low sugar diet.  For insomnia will make limited number of valium available for you to use each month. Reminder not to use with oxycodone.  For chronic knee pain, refill oxycodone. Hopefully you will get insurance and disability. Then consider knee replacements.  Asking staff to call you and get you scheduled for uds and update on contract.  Follow up date 4-8 months or as needed  Follow Up Instructions:    I discussed the assessment and treatment plan with the patient. The patient was provided an opportunity to ask questions and all were answered. The patient agreed with the plan and demonstrated an understanding of the instructions.   The patient was advised to call back or seek an in-person evaluation if the symptoms worsen or if the condition fails to improve as anticipated.  I spent  cumulative 30 minutes of  care with patient both during, before(reviewing chart and after office visit( arranging him to get drug screen and updated contracted.)   Esperanza Richters, PA-C   Review of Systems  Constitutional: Negative for chills, fatigue, fever and unexpected weight change.  Respiratory: Negative for cough, chest tightness, shortness of breath and wheezing.   Cardiovascular: Negative for chest pain and palpitations.  Gastrointestinal: Negative for abdominal pain.  Musculoskeletal: Negative for back pain.  Skin: Negative for rash.  Hematological: Negative for adenopathy. Does not bruise/bleed easily.  Psychiatric/Behavioral: Negative for behavioral problems,  confusion and dysphoric mood. The patient is not nervous/anxious.        Objective:   Physical Exam        Assessment & Plan:

## 2019-09-19 NOTE — Telephone Encounter (Signed)
Impravata was out day I saw him. Could not send electronically. I carried down his rx of valium and oxycodone. Gave to pharmacist.

## 2019-09-19 NOTE — Telephone Encounter (Signed)
Provider refused already

## 2019-10-16 ENCOUNTER — Other Ambulatory Visit (INDEPENDENT_AMBULATORY_CARE_PROVIDER_SITE_OTHER): Payer: Self-pay

## 2019-10-16 ENCOUNTER — Other Ambulatory Visit: Payer: Self-pay | Admitting: Medical

## 2019-10-16 ENCOUNTER — Other Ambulatory Visit: Payer: Self-pay

## 2019-10-16 DIAGNOSIS — M25561 Pain in right knee: Secondary | ICD-10-CM

## 2019-10-16 DIAGNOSIS — G8929 Other chronic pain: Secondary | ICD-10-CM

## 2019-10-16 DIAGNOSIS — M25562 Pain in left knee: Secondary | ICD-10-CM

## 2019-10-17 LAB — PAIN MGMT, PROFILE 8 W/CONF, U
6 Acetylmorphine: NEGATIVE ng/mL
Alcohol Metabolites: NEGATIVE ng/mL (ref <500)
Amphetamines: NEGATIVE ng/mL
Benzodiazepines: NEGATIVE ng/mL
Buprenorphine, Urine: NEGATIVE ng/mL
Cocaine Metabolite: NEGATIVE ng/mL
Creatinine: 126.4 mg/dL
MDMA: NEGATIVE ng/mL
Marijuana Metabolite: NEGATIVE ng/mL
Opiates: NEGATIVE ng/mL
Oxidant: NEGATIVE ug/mL
Oxycodone: NEGATIVE ng/mL
pH: 7.5 (ref 4.5–9.0)

## 2019-10-17 NOTE — Telephone Encounter (Addendum)
Last Oxycodone-acetaminophen RX: 09/18/19, #60 Last Diazepam RX:  09/18/19, #8 Last OV:  09/18/19 Next OV:  Not scheduled UDS:  10/16/19 CSC:  10/16/19  RX valium and oxycodone sent to pt pharmacy.

## 2019-10-18 MED FILL — diazePAM 5 MG TABS: 5 | 8 days supply | Qty: 8 | Fill #0

## 2019-10-18 MED FILL — OXYCODONE/APAP 7.5/325MG: 7.5-325 | 30 days supply | Qty: 60 | Fill #0

## 2019-11-13 ENCOUNTER — Other Ambulatory Visit: Payer: Self-pay | Admitting: Medical

## 2019-11-13 MED FILL — HYDROCHLOROTHIAZIDE 12.5 MG: 12.5 | 90 days supply | Qty: 90 | Fill #3

## 2019-11-13 MED FILL — SIMVASTATIN 20 MG TABLET: 20 | 30 days supply | Qty: 30 | Fill #1

## 2019-11-13 NOTE — Telephone Encounter (Addendum)
Requesting: oxycodone  Contract: 11/06/19 UDS:10/16/19 Last Visit:10/05/19 Next Visit:none Last Refill:10/17/19  Please Advise  Refilled med. Controlled med site reviewed.  Esperanza Richters, PA-C

## 2019-11-14 MED FILL — diazePAM 5 MG TABS: 5 | 8 days supply | Qty: 8 | Fill #0

## 2019-11-14 MED FILL — OXYCODONE/APAP 7.5/325MG: 7.5-325 | 30 days supply | Qty: 60 | Fill #0

## 2019-11-15 ENCOUNTER — Telehealth: Payer: Self-pay | Admitting: Medical

## 2019-11-15 MED ORDER — LISINOPRIL 40 MG PO TABS: 40.0000 mg | ORAL_TABLET | Freq: Every day | ORAL | 3 refills | Status: DC

## 2019-11-15 MED FILL — LISINOPRIL 40 MG TABLET: 40 | 90 days supply | Qty: 90 | Fill #1

## 2019-11-15 NOTE — Telephone Encounter (Signed)
Mr Gille is requesting for a refill on lisinopril (ZESTRIL) 40 MG tablet  His last refill was for 90 days. But patient states by [facility] he was taking 2 day instead of one a day. So he ran out early. He is requesting for 30 more days. He dont have insurance and pays out of pocket. Please advise patient is refill can be sent. Thanks

## 2019-12-12 ENCOUNTER — Other Ambulatory Visit: Payer: Self-pay | Admitting: Medical

## 2019-12-12 ENCOUNTER — Telehealth: Payer: Self-pay | Admitting: Medical

## 2019-12-12 NOTE — Telephone Encounter (Addendum)
Requesting: valium & percocet  Contract:11/05/18 UDS:10/16/19 Last Visit:09/18/19 Next Visit:12/18/19 Last Refill:11/14/19(valium) & 11/14/19( percocet)  Please Advise  Database reviewed.  Rx refill sent to pt pharmacy.

## 2019-12-12 NOTE — Telephone Encounter (Signed)
error 

## 2019-12-13 MED FILL — SIMVASTATIN 20 MG TABLET: 20 | 30 days supply | Qty: 30 | Fill #0

## 2019-12-14 ENCOUNTER — Other Ambulatory Visit: Payer: Self-pay

## 2019-12-15 MED FILL — OXYCODONE/APAP 7.5/325MG: 7.5-325 | 30 days supply | Qty: 60 | Fill #0

## 2019-12-15 MED FILL — diazePAM 5 MG TABS: 5 | 8 days supply | Qty: 8 | Fill #0

## 2019-12-18 ENCOUNTER — Ambulatory Visit: Payer: Self-pay | Admitting: Medical

## 2020-01-15 ENCOUNTER — Other Ambulatory Visit: Payer: Self-pay

## 2020-01-15 ENCOUNTER — Encounter: Payer: Self-pay | Admitting: Medical

## 2020-01-15 ENCOUNTER — Telehealth: Payer: Self-pay | Admitting: Medical

## 2020-01-15 ENCOUNTER — Ambulatory Visit (INDEPENDENT_AMBULATORY_CARE_PROVIDER_SITE_OTHER): Payer: Self-pay | Admitting: Medical

## 2020-01-15 VITALS — BP 140/80 | HR 79 | Temp 97.0°F | Resp 18 | Ht 73.0 in | Wt 196.8 lb

## 2020-01-15 DIAGNOSIS — M25561 Pain in right knee: Secondary | ICD-10-CM

## 2020-01-15 DIAGNOSIS — G8929 Other chronic pain: Secondary | ICD-10-CM

## 2020-01-15 DIAGNOSIS — M25562 Pain in left knee: Secondary | ICD-10-CM

## 2020-01-15 DIAGNOSIS — I10 Essential (primary) hypertension: Secondary | ICD-10-CM

## 2020-01-15 DIAGNOSIS — F419 Anxiety disorder, unspecified: Secondary | ICD-10-CM

## 2020-01-15 DIAGNOSIS — E785 Hyperlipidemia, unspecified: Secondary | ICD-10-CM

## 2020-01-15 LAB — COMPREHENSIVE METABOLIC PANEL
ALT: 13 U/L (ref 0–53)
AST: 14 U/L (ref 0–37)
Albumin: 4.5 g/dL (ref 3.5–5.2)
Alkaline Phosphatase: 67 U/L (ref 39–117)
BUN: 14 mg/dL (ref 6–23)
CO2: 27 mEq/L (ref 19–32)
Calcium: 9.7 mg/dL (ref 8.4–10.5)
Chloride: 102 mEq/L (ref 96–112)
Creatinine, Ser: 0.83 mg/dL (ref 0.40–1.50)
GFR: 94.35 mL/min (ref >60.00)
Glucose, Bld: 135 mg/dL — ABNORMAL HIGH (ref 70–99)
Potassium: 5 mEq/L (ref 3.5–5.1)
Sodium: 138 mEq/L (ref 135–145)
Total Bilirubin: 0.2 mg/dL (ref 0.2–1.2)
Total Protein: 6.4 g/dL (ref 6.0–8.3)

## 2020-01-15 LAB — LIPID PANEL
Cholesterol: 132 mg/dL (ref 0–200)
HDL: 45.4 mg/dL (ref >39.00)
LDL Cholesterol: 64 mg/dL (ref 0–99)
NonHDL: 86.96
Total CHOL/HDL Ratio: 3
Triglycerides: 114 mg/dL (ref 0.0–149.0)
VLDL: 22.8 mg/dL (ref 0.0–40.0)

## 2020-01-15 MED ORDER — AMLODIPINE BESYLATE 10 MG PO TABS: 10.0000 mg | ORAL_TABLET | Freq: Every day | ORAL | 3 refills | Status: DC

## 2020-01-15 MED ORDER — DIAZEPAM 5 MG PO TABS: ORAL_TABLET | ORAL | 1 refills | Status: DC

## 2020-01-15 MED ORDER — OXYCODONE-ACETAMINOPHEN 7.5-325 MG PO TABS: 1.0000 | ORAL_TABLET | Freq: Two times a day (BID) | ORAL | 0 refills | Status: DC | PRN

## 2020-01-15 MED ORDER — SIMVASTATIN 20 MG PO TABS: 20.0000 mg | ORAL_TABLET | Freq: Every day | ORAL | 5 refills | Status: DC

## 2020-01-15 MED FILL — AMLODIPINE BESYLATE 10 MG T: 10 | 90 days supply | Qty: 90 | Fill #0

## 2020-01-15 MED FILL — diazePAM 5 MG TABS: 5 | 8 days supply | Qty: 8 | Fill #0

## 2020-01-15 MED FILL — OXYCODONE/APAP 7.5/325MG: 7.5-325 | 30 days supply | Qty: 60 | Fill #0

## 2020-01-15 NOTE — Telephone Encounter (Signed)
Rx simvastatin sent to pt pharmacy. 

## 2020-01-15 NOTE — Progress Notes (Signed)
Subjective:    Patient ID: Todd Jones, male    DOB: [birth year], 59 y.o.   MRN: [id number]  HPI  Pt had high blood pressure. No cardiac or neurologic signs or symptoms. Has not been checking his blood pressure.  Hx of severe knee pain. Bone on bone. On oxycodone for the severe pain.  Hx of anxiety and is on valium sparingly/rarely.  Hx of high cholesterol. On zocor.   Review of Systems  Constitutional: Negative for chills, fatigue and fever.  Respiratory: Negative for cough, chest tightness, shortness of breath and wheezing.   Cardiovascular: Negative for chest pain and palpitations.  Gastrointestinal: Negative for abdominal pain.  Musculoskeletal: Negative for back pain, neck pain and neck stiffness.  Skin: Negative for rash.  Neurological: Negative for dizziness, syncope, weakness and numbness.  Hematological: Negative for adenopathy. Does not bruise/bleed easily.  Psychiatric/Behavioral: Negative for behavioral problems, confusion and sleep disturbance. The patient is not nervous/anxious.     Past Medical History:  Diagnosis Date  . Arthritis   . GERD (gastroesophageal reflux disease)   . H/O hiatal hernia   . Hypertension   . Insomnia      Social History   Socioeconomic History  . Marital status: Married    Spouse name: Not on file  . Number of children: Not on file  . Years of education: Not on file  . Highest education level: Not on file  Occupational History  . Not on file  Tobacco Use  . Smoking status: Former Games developer  . Smokeless tobacco: Never Used  Substance and Sexual Activity  . Alcohol use: No  . Drug use: Yes    Types: Marijuana  . Sexual activity: Not on file  Other Topics Concern  . Not on file  Social History Narrative  . Not on file   Social Determinants of Health   Financial Resource Strain:   . Difficulty of Paying Living Expenses:   Food Insecurity:   . Worried About Programme researcher, broadcasting/film/video in the Last Year:   . Barista  in the Last Year:   Transportation Needs:   . Freight forwarder (Medical):   Marland Kitchen Lack of Transportation (Non-Medical):   Physical Activity:   . Days of Exercise per Week:   . Minutes of Exercise per Session:   Stress:   . Feeling of Stress :   Social Connections:   . Frequency of Communication with Friends and Family:   . Frequency of Social Gatherings with Friends and Family:   . Attends Religious Services:   . Active Member of Clubs or Organizations:   . Attends Banker Meetings:   Marland Kitchen Marital Status:   Intimate Partner Violence:   . Fear of Current or Ex-Partner:   . Emotionally Abused:   Marland Kitchen Physically Abused:   . Sexually Abused:     Past Surgical History:  Procedure Laterality Date  . CHOLECYSTECTOMY    . INGUINAL HERNIA REPAIR  10/14/2012   Procedure: LAPAROSCOPIC INGUINAL HERNIA;  Surgeon: Axel Filler, MD;  Location: [facility];  Service: General;  Laterality: Right;  . INSERTION OF MESH  10/14/2012   Procedure: INSERTION OF MESH;  Surgeon: Axel Filler, MD;  Location: [facility];  Service: General;  Laterality: Right;  . KNEE ARTHROSCOPY     right x 3  . KNEE FUSION      Family History  Problem Relation Age of Onset  . Cancer Maternal Grandfather  lung  . Alcohol abuse Maternal Grandfather     Allergies  Allergen Reactions  . Hydrocodone     Stomach upset  . Toprol Xl [Metoprolol Tartrate]     confusion    Current Outpatient Medications on File Prior to Visit  Medication Sig Dispense Refill  . amLODipine (NORVASC) 10 MG tablet Take 1 tablet (10 mg total) by mouth daily. 90 tablet 3  . diazepam (VALIUM) 5 MG tablet TAKE 1 TABLET BY MOUTH AT BEDTIME AS NEEDED FOR INSOMNIA AND ANXIETY 8 tablet 0  . hydrochlorothiazide (MICROZIDE) 12.5 MG capsule Take 1 capsule (12.5 mg total) by mouth daily. 90 capsule 3  . lisinopril (ZESTRIL) 40 MG tablet Take 1 tablet (40 mg total) by mouth daily. 90 tablet 3  . oxyCODONE-acetaminophen (PERCOCET) 7.5-325  MG tablet TAKE 1 TABLET BY MOUTH EVERY 12 HOURS AS NEEDED FOR PAIN 60 tablet 0  . simvastatin (ZOCOR) 20 MG tablet TAKE 1 TABLET BY MOUTH AT BEDTIME 30 tablet 3  . traZODone (DESYREL) 50 MG tablet Take 0.5-1 tablets (25-50 mg total) by mouth at bedtime as needed for sleep. (Patient not taking: Reported on 01/15/2020) 30 tablet 5   No current facility-administered medications on file prior to visit.    BP (!) 161/86 (BP Location: Left Arm, Patient Position: Sitting, Cuff Size: Normal)   Pulse 79   Temp (!) 97 F (36.1 C) (Temporal)   Resp 18   Ht 6\' 1"  (1.854 m)   Wt 196 lb 12.8 oz (89.3 kg)   SpO2 96%   BMI 25.96 kg/m       Objective:   Physical Exam  General Mental Status- Alert. General Appearance- Not in acute distress.   Skin General: Color- Normal Color. Moisture- Normal Moisture.  Neck Carotid Arteries- Normal color. Moisture- Normal Moisture. No carotid bruits. No JVD.  Chest and Lung Exam Auscultation: Breath Sounds:-Normal.  Cardiovascular Auscultation:Rythm- Regular. Murmurs & Other Heart Sounds:Auscultation of the heart reveals- No Murmurs.  Abdomen Inspection:-Inspeection Normal. Palpation/Percussion:Note:No mass. Palpation and Percussion of the abdomen reveal- Non Tender, Non Distended + BS, no rebound or guarding.   Neurologic Cranial Nerve exam:- CN III-XII intact(No nystagmus), symmetric smile. Strength:- 5/5 equal and symmetric strength both upper and lower extremities.      Assessment & Plan:  For htn continue current bp meds. On second check bp better.  For anxiety and insomnia continue valium very sparingly. Not to use with pain medicine.  For chronic knee pain continue oxycodone.  For high cholesterol will get lipid panel and cmp today.  Follow up in 3 months or pain.  Mackie Pai, PA-C   Time spent with patient today was 25  minutes which consisted of chart review, discussing diagnosis, work up treatment and documentation.

## 2020-01-15 NOTE — Patient Instructions (Signed)
For htn continue current bp meds. On second check bp better.  For anxiety and insomnia continue valium very sparingly. Not to use with pain medicine.  For chronic knee pain continue oxycodone.  For high cholesterol will get lipid panel and cmp today.  Follow up in 3 months or pain.

## 2020-01-16 MED FILL — SIMVASTATIN 20 MG TABLET: 20 | 30 days supply | Qty: 30 | Fill #0

## 2020-02-13 ENCOUNTER — Other Ambulatory Visit: Payer: Self-pay | Admitting: Medical

## 2020-02-13 MED FILL — LISINOPRIL 40 MG TABLET: 40 | 90 days supply | Qty: 90 | Fill #2

## 2020-02-13 NOTE — Telephone Encounter (Addendum)
Requesting: valium & percocet  Contract:11/06/19 UDS:10/16/2019 Last Visit:01/15/20 Next Visit:n/a Last Refill:valuim(5/3) & percocet(01/15/20)  Please Advise  Refilled rx. Want him to have in office controlled med visit in september

## 2020-02-14 MED FILL — OXYCODONE/APAP 7.5/325MG: 7.5-325 | 30 days supply | Qty: 60 | Fill #0

## 2020-02-14 MED FILL — diazePAM 5 MG TABS: 5 | 8 days supply | Qty: 8 | Fill #0

## 2020-03-12 ENCOUNTER — Other Ambulatory Visit: Payer: Self-pay | Admitting: Medical

## 2020-03-12 MED FILL — SIMVASTATIN 20 MG TABLET: 20 | 30 days supply | Qty: 30 | Fill #2

## 2020-03-12 NOTE — Telephone Encounter (Signed)
Requesting: Valium & Percocet  Contract:11/06/19 UDS:10/16/19 Last Visit:5/3/ Next Visit:n/a Last Refill:02/14/20 / Wynelle Link Advise

## 2020-03-14 MED FILL — diazePAM 5 MG TABS: 5 | 8 days supply | Qty: 8 | Fill #0

## 2020-03-14 MED FILL — OXYCODONE/APAP 7.5/325MG: 7.5-325 | 30 days supply | Qty: 60 | Fill #0

## 2020-03-14 NOTE — Telephone Encounter (Signed)
I sent you old note that summarizes his hx of knee pain. He declined knee surgery. States ortho advised knee replacement. In past he declined as wife had cancer/financial issues in the past.  He still does not want to do surgery.  He has not asked for higher dose or run out early.  I have cut him back on number of valium and advise limited use and not to use with percocet. Anxiety overall less since wife passed.  His uds, contract and review of controlled med site up to date.  Have documented strict caution benzo use and oxy whenever he is in for office visit.  Tried tramadol and failed. Also he had allergy to norco.  Sending for review.

## 2020-03-15 NOTE — Telephone Encounter (Signed)
Ok. Will get repeat xray next time in and discuss with him. (offer)  Thanks,

## 2020-03-15 NOTE — Progress Notes (Signed)
Reviewed.  Agree.  Yvonne R Lowne Chase, DO  

## 2020-03-15 NOTE — Telephone Encounter (Signed)
If refusing surgery -- may need pain management

## 2020-04-10 ENCOUNTER — Other Ambulatory Visit: Payer: Self-pay | Admitting: Medical

## 2020-04-10 MED FILL — AMLODIPINE BESYLATE 10 MG T: 10 | 90 days supply | Qty: 90 | Fill #1

## 2020-04-10 MED FILL — SIMVASTATIN 20 MG TABLET: 20 | 30 days supply | Qty: 30 | Fill #3

## 2020-04-11 MED FILL — HYDROCHLOROTHIAZIDE 12.5 MG: 12.5 | 90 days supply | Qty: 90 | Fill #0

## 2020-04-13 ENCOUNTER — Telehealth: Payer: Self-pay | Admitting: Medical

## 2020-04-13 NOTE — Telephone Encounter (Signed)
I am pretty sure I sent your controlled med update on this pt. He has hx of severe degenerative knees with hx of recommended knee replacement. Ortho recommeneded knee replacement. Pt declined in past due to finances and his wife had cancer/passed. Financial issues related to that and could not miss work. Also anxiety and insomnia related to wife passing.   He is on percocet daily and very rare sparingl valium use. He was on more frequent dosing valium  and cut him back to just 8 valium a month.   Allergy to hydrocodone.  He follow controlled med rules. Have explained not to use percocet and valium together on each controlled me visit.  In September on his controlled med visit will get knee xray and touch base with him on seeing orthopedist again.  Sorry for sending duplicate. I remember updating you on this then realizing attached to wrong person chart. I deleted that form other pt chart.  Thanks,

## 2020-04-13 NOTE — Telephone Encounter (Signed)
Definitely agree with seeing ortho again I had a pt similar to this----  its going to be hard to get him off the meds even after a knee replacement

## 2020-04-13 NOTE — Telephone Encounter (Signed)
Rx percocet and limited number of valium sent to pt pharmacy.

## 2020-04-15 MED FILL — OXYCODONE/APAP 7.5/325MG: 7.5-325 | 30 days supply | Qty: 60 | Fill #0

## 2020-04-15 MED FILL — diazePAM 5 MG TABS: 5 | 8 days supply | Qty: 8 | Fill #0

## 2020-05-15 ENCOUNTER — Ambulatory Visit (HOSPITAL_BASED_OUTPATIENT_CLINIC_OR_DEPARTMENT_OTHER)
Admission: RE | Admit: 2020-05-15 | Discharge: 2020-05-15 | Disposition: A | Discharge status: Home / Self Care | Payer: Self-pay | Source: Ambulatory Visit | Attending: Medical | Admitting: Medical

## 2020-05-15 ENCOUNTER — Ambulatory Visit (INDEPENDENT_AMBULATORY_CARE_PROVIDER_SITE_OTHER): Payer: Self-pay | Admitting: Medical

## 2020-05-15 ENCOUNTER — Other Ambulatory Visit: Payer: Self-pay

## 2020-05-15 ENCOUNTER — Other Ambulatory Visit: Payer: Self-pay | Admitting: Medical

## 2020-05-15 ENCOUNTER — Encounter: Payer: Self-pay | Admitting: Medical

## 2020-05-15 VITALS — BP 120/80 | HR 87 | Temp 98.2°F | Resp 20 | Ht 73.0 in | Wt 201.6 lb

## 2020-05-15 DIAGNOSIS — E785 Hyperlipidemia, unspecified: Secondary | ICD-10-CM

## 2020-05-15 DIAGNOSIS — G8929 Other chronic pain: Secondary | ICD-10-CM | POA: Insufficient documentation

## 2020-05-15 DIAGNOSIS — G4700 Insomnia, unspecified: Secondary | ICD-10-CM

## 2020-05-15 DIAGNOSIS — M25562 Pain in left knee: Secondary | ICD-10-CM

## 2020-05-15 DIAGNOSIS — F419 Anxiety disorder, unspecified: Secondary | ICD-10-CM

## 2020-05-15 DIAGNOSIS — M25561 Pain in right knee: Secondary | ICD-10-CM | POA: Insufficient documentation

## 2020-05-15 DIAGNOSIS — I10 Essential (primary) hypertension: Secondary | ICD-10-CM

## 2020-05-15 MED ORDER — OXYCODONE-ACETAMINOPHEN 7.5-325 MG PO TABS: 1.0000 | ORAL_TABLET | Freq: Two times a day (BID) | ORAL | 0 refills | Status: DC | PRN

## 2020-05-15 MED ORDER — LISINOPRIL 40 MG PO TABS: 40.0000 mg | ORAL_TABLET | Freq: Every day | ORAL | 3 refills | Status: DC

## 2020-05-15 MED ORDER — SIMVASTATIN 20 MG PO TABS: 20.0000 mg | ORAL_TABLET | Freq: Every day | ORAL | 2 refills | Status: DC

## 2020-05-15 MED ORDER — HYDROCHLOROTHIAZIDE 12.5 MG PO CAPS: 12.5000 mg | ORAL_CAPSULE | Freq: Every day | ORAL | 3 refills | Status: DC

## 2020-05-15 MED ORDER — DIAZEPAM 5 MG PO TABS: ORAL_TABLET | ORAL | 3 refills | Status: DC

## 2020-05-15 MED ORDER — AMLODIPINE BESYLATE 10 MG PO TABS: 10.0000 mg | ORAL_TABLET | Freq: Every day | ORAL | 3 refills | Status: DC

## 2020-05-15 MED FILL — diazePAM 5 MG TABS: 5 | 8 days supply | Qty: 8 | Fill #0

## 2020-05-15 MED FILL — HYDROCHLOROTHIAZIDE 12.5 MG: 12.5 | 90 days supply | Qty: 90 | Fill #0

## 2020-05-15 MED FILL — SIMVASTATIN 20 MG TABLET: 20 | 30 days supply | Qty: 30 | Fill #0

## 2020-05-15 MED FILL — LISINOPRIL 40 MG TABS: 40 | 90 days supply | Qty: 90 | Fill #0

## 2020-05-15 MED FILL — OXYCODONE/APAP 7.5/325MG: 7.5-325 | 30 days supply | Qty: 60 | Fill #0

## 2020-05-15 MED FILL — AMLODIPINE BESYLATE 10 MG T: 10 | 90 days supply | Qty: 90 | Fill #0

## 2020-05-15 NOTE — Patient Instructions (Addendum)
Htn is well controlled. Continue lisinopril, hctz and amlodipine.  For high cholesterol continue zocor.  Will get labs done early spring when you get insurance.  For anxiety and insomnia limited number of valium. Rx advisment.   Chronic knee pain. Get rt knee xray today. Refill oxycodone.  Consider surgery when get insurance.   Follow up early January or as needed

## 2020-05-15 NOTE — Progress Notes (Signed)
Subjective:    Patient ID: Todd Jones, male    DOB: 08/23/1961, 59 y.o.   MRN: [id number]  HPI  Pt in for controlled med follow up.   Pt has hx of severe degenerative changes/osteoarthritis. Pt rt knee pain is the worse. He had 3 surgeries in the past/meniscus repair. Former did not get surgeries as he noted in past was recommend knee replacement. But financial issues prevented from getting surgery. Pt on oxycodone due to severe pain. Pt states norco was not adequate and upset his stomach.  Pt up to date on contract and uds.  Pt has htn his bp is well controlled. On lisinopril and hctz.  Also on high cholesterol. On zocor.  Pt is working for Programme researcher, broadcasting/film/video.  Pt is vaccinated against covid.   Review of Systems  Constitutional: Negative for chills, fatigue and fever.  Respiratory: Negative for cough, chest tightness, shortness of breath and wheezing.   Cardiovascular: Negative for chest pain and palpitations.  Gastrointestinal: Negative for abdominal pain, blood in stool, nausea and vomiting.  Musculoskeletal:       Knee pain.  Skin: Negative for rash.  Neurological: Negative for dizziness, syncope, weakness, numbness and headaches.  Hematological: Negative for adenopathy. Does not bruise/bleed easily.  Psychiatric/Behavioral: Negative for behavioral problems and confusion.   Past Medical History:  Diagnosis Date   Arthritis    GERD (gastroesophageal reflux disease)    H/O hiatal hernia    Hypertension    Insomnia      Social History   Socioeconomic History   Marital status: Married    Spouse name: Not on file   Number of children: Not on file   Years of education: Not on file   Highest education level: Not on file  Occupational History   Not on file  Tobacco Use   Smoking status: Former Smoker   Smokeless tobacco: Never Used  Substance and Sexual Activity   Alcohol use: No   Drug use: Yes    Types: Marijuana   Sexual activity: Not on file    Other Topics Concern   Not on file  Social History Narrative   Not on file   Social Determinants of Health   Financial Resource Strain:    Difficulty of Paying Living Expenses: Not on file  Food Insecurity:    Worried About Running Out of Food in the Last Year: Not on file   [employer] of Food in the Last Year: Not on file  Transportation Needs:    Lack of Transportation (Medical): Not on file   Lack of Transportation (Non-Medical): Not on file  Physical Activity:    Days of Exercise per Week: Not on file   Minutes of Exercise per Session: Not on file  Stress:    Feeling of Stress : Not on file  Social Connections:    Frequency of Communication with Friends and Family: Not on file   Frequency of Social Gatherings with Friends and Family: Not on file   Attends Religious Services: Not on file   Active Member of Clubs or Organizations: Not on file   Attends Banker Meetings: Not on file   Marital Status: Not on file  Intimate Partner Violence:    Fear of Current or Ex-Partner: Not on file   Emotionally Abused: Not on file   Physically Abused: Not on file   Sexually Abused: Not on file    Past Surgical History:  Procedure Laterality Date   CHOLECYSTECTOMY  INGUINAL HERNIA REPAIR  10/14/2012   Procedure: LAPAROSCOPIC INGUINAL HERNIA;  Surgeon: Axel Filler, MD;  Location: [facility];  Service: General;  Laterality: Right;   INSERTION OF MESH  10/14/2012   Procedure: INSERTION OF MESH;  Surgeon: Axel Filler, MD;  Location: [facility];  Service: General;  Laterality: Right;   KNEE ARTHROSCOPY     right x 3   KNEE FUSION      Family History  Problem Relation Age of Onset   Cancer Maternal Grandfather        lung   Alcohol abuse Maternal Grandfather     Allergies  Allergen Reactions   Hydrocodone     Stomach upset   Toprol Xl [Metoprolol Tartrate]     confusion    Current Outpatient Medications on File Prior to Visit   Medication Sig Dispense Refill   amLODipine (NORVASC) 10 MG tablet Take 1 tablet (10 mg total) by mouth daily. 90 tablet 3   diazepam (VALIUM) 5 MG tablet TAKE 1 TABLET BY MOUTH EVERY NIGHT AT BEDTIME AS NEEDED FOR INSOMINA AND ANXIETY 8 tablet 0   hydrochlorothiazide (MICROZIDE) 12.5 MG capsule Take 1 capsule (12.5 mg total) by mouth daily. 90 capsule 0   lisinopril (ZESTRIL) 40 MG tablet Take 1 tablet (40 mg total) by mouth daily. 90 tablet 3   oxyCODONE-acetaminophen (PERCOCET) 7.5-325 MG tablet TAKE 1 TABLET BY MOUTH EVERY 12 HOURS AS NEEDED FOR PAIN 60 tablet 0   simvastatin (ZOCOR) 20 MG tablet Take 1 tablet (20 mg total) by mouth at bedtime. 30 tablet 5   traZODone (DESYREL) 50 MG tablet Take 0.5-1 tablets (25-50 mg total) by mouth at bedtime as needed for sleep. (Patient not taking: Reported on 01/15/2020) 30 tablet 5   No current facility-administered medications on file prior to visit.    BP 138/89    Pulse 87    Temp 98.2 F (36.8 C) (Oral)    Resp 20    Ht 6\' 1"  (1.854 m)    Wt 201 lb 9.6 oz (91.4 kg)    SpO2 98%    BMI 26.60 kg/m       Objective:   Physical Exam   General Mental Status- Alert. General Appearance- Not in acute distress.   Skin General: Color- Normal Color. Moisture- Normal Moisture.  Neck Carotid Arteries- Normal color. Moisture- Normal Moisture. No carotid bruits. No JVD.  Chest and Lung Exam Auscultation: Breath Sounds:-Normal.  Cardiovascular Auscultation:Rythm- Regular. Murmurs & Other Heart Sounds:Auscultation of the heart reveals- No Murmurs.  Abdomen Inspection:-Inspeection Normal. Palpation/Percussion:Note:No mass. Palpation and Percussion of the abdomen reveal- Non Tender, Non Distended + BS, no rebound or guarding.    Neurologic Cranial Nerve exam:- CN III-XII intact(No nystagmus), symmetric smile. Strength:- 5/5 equal and symmetric strength both upper and lower extremities.     Knees- rt knee swollen. Pain rom. Left  knee- not as swollen. Also pain on rom. Assessment & Plan:  Htn is well controlled. Continue lisinopril, hctz and amlodipine.  For high cholesterol continue zocor.  Will get labs done early spring when you get insurance.  For anxiety and insomnia limited number of valium. Rx advisment. Limited per month rx. Not to take with oxycodone.  Chronic knee pain. Get rt knee xray today. Refill oxycodone.  Consider surgery when get insurance.   Follow up early January or as needed  February, PA-C

## 2020-06-11 ENCOUNTER — Telehealth: Payer: Self-pay | Admitting: Medical

## 2020-06-11 ENCOUNTER — Other Ambulatory Visit: Payer: Self-pay | Admitting: Medical

## 2020-06-11 NOTE — Telephone Encounter (Signed)
Refilled pt oxycodone. Up to date uds and contract. Reviewed controlled med site.

## 2020-06-12 MED FILL — SIMVASTATIN 20 MG TABLET: 20 | 30 days supply | Qty: 30 | Fill #1

## 2020-06-12 MED FILL — OXYCODONE/APAP 7.5/325MG: 7.5-325 | 30 days supply | Qty: 60 | Fill #0

## 2020-06-12 MED FILL — diazePAM 5 MG TABS: 5 | 8 days supply | Qty: 8 | Fill #1

## 2020-07-10 ENCOUNTER — Other Ambulatory Visit: Payer: Self-pay | Admitting: Medical

## 2020-07-10 MED FILL — diazePAM 5 MG TABS: 5 | 8 days supply | Qty: 8 | Fill #2

## 2020-07-10 MED FILL — SIMVASTATIN 20 MG TABLET: 20 | 30 days supply | Qty: 30 | Fill #2

## 2020-07-10 NOTE — Telephone Encounter (Addendum)
Requesting:  percocet Contract: 11/06/19 UDS:10/16/19 Last Visit:05/15/20 Next Visit:n/a Last Refill:06/11/20  Please Advise  Refilled rx today.

## 2020-07-11 ENCOUNTER — Other Ambulatory Visit: Payer: Self-pay | Admitting: Medical

## 2020-07-11 MED FILL — OXYCODONE/APAP 7.5/325MG: 7.5-325 | 30 days supply | Qty: 60 | Fill #0

## 2020-08-12 ENCOUNTER — Other Ambulatory Visit: Payer: Self-pay | Admitting: Medical

## 2020-08-12 MED FILL — LISINOPRIL 40 MG TABS: 40 | 90 days supply | Qty: 90 | Fill #1

## 2020-08-12 MED FILL — diazePAM 5 MG TABS: 5 | 8 days supply | Qty: 8 | Fill #3

## 2020-08-12 NOTE — Telephone Encounter (Signed)
Requesting: oxycodone Contract:2/21 UDS:2/21 LastVisit:9/21 Next Visit:n/a Last Refill:07/11/20  Please Advise

## 2020-08-13 ENCOUNTER — Other Ambulatory Visit: Payer: Self-pay | Admitting: Medical

## 2020-08-13 MED FILL — HYDROCHLOROTHIAZIDE 12.5 MG: 12.5 | 90 days supply | Qty: 90 | Fill #1

## 2020-08-13 MED FILL — AMLODIPINE BESYLATE 10 MG T: 10 | 90 days supply | Qty: 90 | Fill #1

## 2020-08-13 MED FILL — SIMVASTATIN 20 MG TABLET: 20 | 90 days supply | Qty: 90 | Fill #0

## 2020-08-13 MED FILL — OXYCODONE/APAP 7.5/325MG: 7.5-325 | 30 days supply | Qty: 60 | Fill #0

## 2020-08-13 NOTE — Telephone Encounter (Signed)
Rx oxcycodone refill sent to pharmacy.

## 2020-08-13 NOTE — Telephone Encounter (Signed)
Patient called to check status of medication refill, patient states he is out of med's and needs them as soon as possible

## 2020-09-09 ENCOUNTER — Other Ambulatory Visit: Payer: Self-pay | Admitting: Medical

## 2020-09-09 NOTE — Telephone Encounter (Signed)
Pt is due for follow up. Can we get him in this week. If he can't come in this week then would give one week supply pending appointment sometime next week.   Please offer/advise appointment this week. c

## 2020-09-09 NOTE — Telephone Encounter (Signed)
Requesting: oxycodone and diazepam  Contract: 09/29/2018 UDS: 10/16/2019 Last Visit: 05/15/2020 Next Visit: 09/16/2020 Last Refill on oxycodone: 08/13/2020 #60 and 0RF Last Refill on diazepam: 05/15/2020 #8 and 3RF   Please Advise

## 2020-09-12 ENCOUNTER — Other Ambulatory Visit: Payer: Self-pay

## 2020-09-12 ENCOUNTER — Encounter: Payer: Self-pay | Admitting: Medical

## 2020-09-12 ENCOUNTER — Other Ambulatory Visit: Payer: Self-pay | Admitting: Medical

## 2020-09-12 ENCOUNTER — Ambulatory Visit (INDEPENDENT_AMBULATORY_CARE_PROVIDER_SITE_OTHER): Payer: Self-pay | Admitting: Medical

## 2020-09-12 VITALS — BP 125/72 | HR 97 | Temp 98.2°F | Ht 73.0 in | Wt 204.0 lb

## 2020-09-12 DIAGNOSIS — G4700 Insomnia, unspecified: Secondary | ICD-10-CM

## 2020-09-12 DIAGNOSIS — M25562 Pain in left knee: Secondary | ICD-10-CM

## 2020-09-12 DIAGNOSIS — E785 Hyperlipidemia, unspecified: Secondary | ICD-10-CM

## 2020-09-12 DIAGNOSIS — F411 Generalized anxiety disorder: Secondary | ICD-10-CM

## 2020-09-12 DIAGNOSIS — R739 Hyperglycemia, unspecified: Secondary | ICD-10-CM

## 2020-09-12 DIAGNOSIS — G8929 Other chronic pain: Secondary | ICD-10-CM

## 2020-09-12 DIAGNOSIS — M25561 Pain in right knee: Secondary | ICD-10-CM

## 2020-09-12 DIAGNOSIS — Z79899 Other long term (current) drug therapy: Secondary | ICD-10-CM

## 2020-09-12 DIAGNOSIS — I10 Essential (primary) hypertension: Secondary | ICD-10-CM

## 2020-09-12 LAB — LIPID PANEL
Cholesterol: 158 mg/dL (ref 0–200)
HDL: 51 mg/dL (ref >39.00)
LDL Cholesterol: 83 mg/dL (ref 0–99)
NonHDL: 107.01
Total CHOL/HDL Ratio: 3
Triglycerides: 118 mg/dL (ref 0.0–149.0)
VLDL: 23.6 mg/dL (ref 0.0–40.0)

## 2020-09-12 LAB — COMPREHENSIVE METABOLIC PANEL
ALT: 18 U/L (ref 0–53)
AST: 14 U/L (ref 0–37)
Albumin: 4.5 g/dL (ref 3.5–5.2)
Alkaline Phosphatase: 49 U/L (ref 39–117)
BUN: 14 mg/dL (ref 6–23)
CO2: 30 mEq/L (ref 19–32)
Calcium: 9.3 mg/dL (ref 8.4–10.5)
Chloride: 104 mEq/L (ref 96–112)
Creatinine, Ser: 0.79 mg/dL (ref 0.40–1.50)
GFR: 96.13 mL/min (ref >60.00)
Glucose, Bld: 108 mg/dL — ABNORMAL HIGH (ref 70–99)
Potassium: 4.4 mEq/L (ref 3.5–5.1)
Sodium: 140 mEq/L (ref 135–145)
Total Bilirubin: 0.4 mg/dL (ref 0.2–1.2)
Total Protein: 6.3 g/dL (ref 6.0–8.3)

## 2020-09-12 LAB — HEMOGLOBIN A1C: Hgb A1c MFr Bld: 5.4 % (ref 4.6–6.5)

## 2020-09-12 MED ORDER — HYDROXYZINE HCL 25 MG PO TABS: ORAL_TABLET | ORAL | 0 refills | Status: DC

## 2020-09-12 MED ORDER — OXYCODONE-ACETAMINOPHEN 7.5-325 MG PO TABS: 1.0000 | ORAL_TABLET | Freq: Two times a day (BID) | ORAL | 0 refills | Status: DC | PRN

## 2020-09-12 MED FILL — hydrOXYzine HCL 25 MG TABS: 25 | 30 days supply | Qty: 30 | Fill #0

## 2020-09-12 MED FILL — OXYCODONE/APAP 7.5/325MG: 7.5-325 | 30 days supply | Qty: 60 | Fill #0

## 2020-09-12 NOTE — Patient Instructions (Signed)
History of chronic knee pain bilaterally.  Worse on the right side.  Your knee x-ray did show some moderate degenerative changes.  I am thinking that you might have recurrent meniscus tear.  I do want to refer you to sports medicine.  They might do an ultrasound and give opinion with her not to have internal derangement.  Let me know when you do get Medicaid.  Can refer you to orthopedics at that time.  Updated controlled medication contract today and UDS.  History of insomnia, presently do not think Ambien is a good option as it is a controlled medication and you are already on 2 controlled meds.  Would recommend that you try hydroxyzine 25 mg at night.  If this is not adequate could add on 10 mg of melatonin.  For hyperlipidemia continue simvastatin and get lipid panel today.  Blood pressure well controlled today.  Continue lisinopril and HCTZ.  For elevated sugar in the past get A1c today.  Follow-up in 4 months or as needed.

## 2020-09-12 NOTE — Addendum Note (Signed)
Addended by: Maximino Sarin on: 09/12/2020 09:43 AM   Modules accepted: Orders

## 2020-09-12 NOTE — Progress Notes (Signed)
Subjective:    Patient ID: Todd Jones, male    DOB: 30-Jun-1961, 59 y.o.   MRN: [id number]  HPI  Pt in for follow up he has chronic severe knee pain.  Pt has hx of severe degenerative changes/osteoarthritis. Pt rt knee pain is the worse. He had 3 surgeries in the past/meniscus repair. Former did not get surgeries as he noted in past was recommend knee replacement. But financial issues prevented from getting surgery. Pt on oxycodone due to severe pain. Pt states norco was not adequate and upset his stomach.  Pt up to date on contract and uds.  Last xray 05-2020.  IMPRESSION: Moderate degenerative joint disease. No acute abnormality seen in the right knee.   Pt planning on getting medicaid.    Pt has htn his bp is well controlled. On lisinopril and hctz.  Also on high cholesterol. On zocor.   Pt is vaccinated against covid.  Pt has insomnia. He used some of son Remus Loffler. He states that helped.   Review of Systems  Constitutional: Negative for chills, fatigue and fever.  Respiratory: Negative for cough, chest tightness, shortness of breath and wheezing.   Cardiovascular: Negative for chest pain and palpitations.  Gastrointestinal: Negative for abdominal pain, constipation, diarrhea and vomiting.  Genitourinary: Negative for difficulty urinating, dysuria, frequency, hematuria, penile pain and scrotal swelling.  Musculoskeletal: Negative for back pain and joint swelling.       Knee pain.  Skin: Negative for rash.  Neurological: Negative for dizziness, speech difficulty, weakness, light-headedness and headaches.  Hematological: Negative for adenopathy. Does not bruise/bleed easily.  Psychiatric/Behavioral: Positive for sleep disturbance. Negative for behavioral problems, confusion and suicidal ideas. The patient is not nervous/anxious.    Past Medical History:  Diagnosis Date   Arthritis    GERD (gastroesophageal reflux disease)    H/O hiatal hernia     Hypertension    Insomnia      Social History   Socioeconomic History   Marital status: Married    Spouse name: Not on file   Number of children: Not on file   Years of education: Not on file   Highest education level: Not on file  Occupational History   Not on file  Tobacco Use   Smoking status: Former Smoker   Smokeless tobacco: Never Used  Substance and Sexual Activity   Alcohol use: No   Drug use: Yes    Types: Marijuana   Sexual activity: Not on file  Other Topics Concern   Not on file  Social History Narrative   Not on file   Social Determinants of Health   Financial Resource Strain: Not on file  Food Insecurity: Not on file  Transportation Needs: Not on file  Physical Activity: Not on file  Stress: Not on file  Social Connections: Not on file  Intimate Partner Violence: Not on file    Past Surgical History:  Procedure Laterality Date   CHOLECYSTECTOMY     INGUINAL HERNIA REPAIR  10/14/2012   Procedure: LAPAROSCOPIC INGUINAL HERNIA;  Surgeon: Axel Filler, MD;  Location: [facility];  Service: General;  Laterality: Right;   INSERTION OF MESH  10/14/2012   Procedure: INSERTION OF MESH;  Surgeon: Axel Filler, MD;  Location: [facility];  Service: General;  Laterality: Right;   KNEE ARTHROSCOPY     right x 3   KNEE FUSION      Family History  Problem Relation Age of Onset   Cancer Maternal Grandfather  lung   Alcohol abuse Maternal Grandfather     Allergies  Allergen Reactions   Hydrocodone     Stomach upset   Toprol Xl [Metoprolol Tartrate]     confusion    Current Outpatient Medications on File Prior to Visit  Medication Sig Dispense Refill   amLODipine (NORVASC) 10 MG tablet Take 1 tablet (10 mg total) by mouth daily. 90 tablet 3   diazepam (VALIUM) 5 MG tablet 1 tab po q hs prn anxiety or insomnia. 8 tablet 3   hydrochlorothiazide (MICROZIDE) 12.5 MG capsule Take 1 capsule (12.5 mg total) by mouth daily. 90 capsule 3    lisinopril (ZESTRIL) 40 MG tablet Take 1 tablet (40 mg total) by mouth daily. 90 tablet 3   oxyCODONE-acetaminophen (PERCOCET) 7.5-325 MG tablet TAKE 1 TABLET BY MOUTH EVERY 12 HOURS AS NEEDED FOR PAIN 60 tablet 0   simvastatin (ZOCOR) 20 MG tablet TAKE 1 TABLET (20 MG TOTAL) BY MOUTH AT BEDTIME. 30 tablet 2   traZODone (DESYREL) 50 MG tablet Take 0.5-1 tablets (25-50 mg total) by mouth at bedtime as needed for sleep. (Patient not taking: No sig reported) 30 tablet 5   No current facility-administered medications on file prior to visit.    BP 125/72    Pulse 97    Temp 98.2 F (36.8 C) (Oral)    Ht 6\' 1"  (1.854 m)    Wt 204 lb (92.5 kg)    SpO2 95%    BMI 26.91 kg/m       Objective:   Physical Exam   Physical Exam   General Mental Status- Alert. General Appearance- Not in acute distress.   Skin General: Color- Normal Color. Moisture- Normal Moisture.  Neck Carotid Arteries- Normal color. Moisture- Normal Moisture. No carotid bruits. No JVD.  Chest and Lung Exam Auscultation: Breath Sounds:-Normal.  Cardiovascular Auscultation:Rythm- Regular. Murmurs & Other Heart Sounds:Auscultation of the heart reveals- No Murmurs.  Abdomen Inspection:-Inspeection Normal. Palpation/Percussion:Note:No mass. Palpation and Percussion of the abdomen reveal- Non Tender, Non Distended + BS, no rebound or guarding.    Neurologic Cranial Nerve exam:- CN III-XII intact(No nystagmus), symmetric smile. Strength:- 5/5 equal and symmetric strength both upper and lower extremities.    Knees- rt knee swollen. Moderate to severe crepitus. Pain over tibial plateau Pain rom. Left knee- not as swollen. Also pain on rom.      Assessment & Plan:  History of chronic knee pain bilaterally.  Worse on the right side.  Your knee x-ray did show some moderate degenerative changes.  I am thinking that you might have recurrent meniscus tear.  I do want to refer you to sports medicine.  They  might do an ultrasound and give opinion with her not to have internal derangement.  Let me know when you do get Medicaid.  Can refer you to orthopedics at that time.  Updated controlled medication contract today and UDS.  History of insomnia, presently do not think Ambien is a good option as it is a controlled medication and you are already on 2 controlled meds.  Would recommend that you try hydroxyzine 25 mg at night.  If this is not adequate could add on 10 mg of melatonin.  For hyperlipidemia continue simvastatin and get lipid panel today.  Blood pressure well controlled today.  Continue lisinopril and HCTZ.  For elevated sugar in the past get A1c today.  Follow-up in 4 months or as needed.  , PA-C

## 2020-09-13 LAB — DRUG MONITORING, PANEL 8 WITH CONFIRMATION, URINE
6 Acetylmorphine: NEGATIVE ng/mL (ref <10)
Alcohol Metabolites: NEGATIVE ng/mL
Amphetamines: NEGATIVE ng/mL (ref <500)
Benzodiazepines: NEGATIVE ng/mL (ref <100)
Buprenorphine, Urine: NEGATIVE ng/mL (ref <5)
Cocaine Metabolite: NEGATIVE ng/mL (ref <150)
Creatinine: 42.5 mg/dL
MDMA: NEGATIVE ng/mL (ref <500)
Marijuana Metabolite: NEGATIVE ng/mL (ref <20)
Opiates: NEGATIVE ng/mL (ref <100)
Oxidant: NEGATIVE ug/mL
Oxycodone: NEGATIVE ng/mL (ref <100)
pH: 6.6 (ref 4.5–9.0)

## 2020-09-13 LAB — DM TEMPLATE

## 2020-09-16 ENCOUNTER — Ambulatory Visit: Payer: Self-pay | Admitting: Medical

## 2020-10-09 ENCOUNTER — Other Ambulatory Visit: Payer: Self-pay | Admitting: Medical

## 2020-10-09 NOTE — Telephone Encounter (Signed)
Requesting: diazepam 5mg  and oxycodone 7.5-325mg  Contract: 09/12/2020 UDS: 09/12/2020 Last Visit: 09/12/2020 Next Visit: 01/09/2021 Last Refill on diazepam 5mg : 05/15/2020 #8 and 3RF Last Refill on oxycodone: 09/12/2020 #60 and 0RF  Please Advise

## 2020-10-10 ENCOUNTER — Telehealth: Payer: Self-pay | Admitting: Medical

## 2020-10-10 ENCOUNTER — Other Ambulatory Visit: Payer: Self-pay | Admitting: Medical

## 2020-10-10 NOTE — Telephone Encounter (Signed)
Refilled pt oxycdone and valium. uds and contract up to date. Controlled med site reviewed. Note limited number of valium given/only 8.

## 2020-10-11 MED FILL — diazePAM 5 MG TABS: 5 | 8 days supply | Qty: 8 | Fill #0

## 2020-10-11 MED FILL — OXYCODONE/APAP 7.5/325MG: 7.5-325 | 30 days supply | Qty: 60 | Fill #0

## 2020-11-07 ENCOUNTER — Other Ambulatory Visit: Payer: Self-pay | Admitting: Medical

## 2020-11-07 MED FILL — LISINOPRIL 40 MG TABS: 40 | 90 days supply | Qty: 90 | Fill #2

## 2020-11-07 MED FILL — diazePAM 5 MG TABS: 5 | 8 days supply | Qty: 8 | Fill #1

## 2020-11-07 MED FILL — SIMVASTATIN 20 MG TABLET: 20 | 90 days supply | Qty: 90 | Fill #0

## 2020-11-07 NOTE — Telephone Encounter (Addendum)
Requesting: oxycodone 7.5-325mg  Contract: 09/12/2020 UDS: 09/12/2020 Last Visit: 09/12/2020 Next Visit: 01/09/2021 Last Refill: 10/10/2020 #60 and 0RF Pt sig: 1 tab q12h prn  Sent prescription to pharmacy. Want pt appointment to be move up to week of 01-02-2021.

## 2020-11-10 ENCOUNTER — Other Ambulatory Visit: Payer: Self-pay | Admitting: Medical

## 2020-11-11 ENCOUNTER — Telehealth: Payer: Self-pay | Admitting: Medical

## 2020-11-11 MED FILL — OXYCODONE/APAP 7.5/325MG: 7.5-325 | 30 days supply | Qty: 60 | Fill #0

## 2020-11-11 NOTE — Telephone Encounter (Signed)
Called pt and lvm to return call.  

## 2020-11-11 NOTE — Telephone Encounter (Signed)
Pt would like a call from CMA, states he want to discuss a medication that Ramon Dredge has written for him before. I explained to pt that doctor can not write a rx for a antibiotic without a visit. States Ramon Dredge has done it before and he does not have the money for a office visit.

## 2020-12-05 ENCOUNTER — Other Ambulatory Visit: Payer: Self-pay | Admitting: Medical

## 2020-12-05 MED FILL — diazePAM 5 MG TABS: 5 | 8 days supply | Qty: 8 | Fill #2

## 2020-12-05 NOTE — Telephone Encounter (Signed)
Requesting: oxycodone Contract: 10/16/19 UDS:09/12/20 Last Visit:  09/12/20 Next Visit: 01/09/21 Last Refill: 11/10/20  Please Advise

## 2020-12-07 ENCOUNTER — Other Ambulatory Visit: Payer: Self-pay | Admitting: Medical

## 2020-12-07 NOTE — Telephone Encounter (Signed)
Refilled oxycdone. Please get pt to come in January 06, 2021 for office/controlled med visit. Stop by my area to discuss before you call pt.

## 2020-12-09 MED FILL — OXYCODONE/APAP 7.5/325MG: 7.5-325 | 30 days supply | Qty: 60 | Fill #0

## 2020-12-10 NOTE — Telephone Encounter (Signed)
Patient changed to 01/06/21 at 8am

## 2020-12-10 NOTE — Telephone Encounter (Signed)
Ok thanks 

## 2021-01-06 ENCOUNTER — Other Ambulatory Visit (HOSPITAL_BASED_OUTPATIENT_CLINIC_OR_DEPARTMENT_OTHER): Payer: Self-pay

## 2021-01-06 ENCOUNTER — Other Ambulatory Visit: Payer: Self-pay

## 2021-01-06 ENCOUNTER — Other Ambulatory Visit: Payer: Self-pay | Admitting: Medical

## 2021-01-06 ENCOUNTER — Ambulatory Visit (INDEPENDENT_AMBULATORY_CARE_PROVIDER_SITE_OTHER): Payer: Self-pay | Admitting: Medical

## 2021-01-06 VITALS — BP 132/82 | HR 66 | Resp 20 | Ht 73.0 in | Wt 198.0 lb

## 2021-01-06 DIAGNOSIS — Z1211 Encounter for screening for malignant neoplasm of colon: Secondary | ICD-10-CM

## 2021-01-06 DIAGNOSIS — I10 Essential (primary) hypertension: Secondary | ICD-10-CM

## 2021-01-06 DIAGNOSIS — F419 Anxiety disorder, unspecified: Secondary | ICD-10-CM

## 2021-01-06 DIAGNOSIS — E785 Hyperlipidemia, unspecified: Secondary | ICD-10-CM

## 2021-01-06 DIAGNOSIS — G8929 Other chronic pain: Secondary | ICD-10-CM

## 2021-01-06 DIAGNOSIS — R739 Hyperglycemia, unspecified: Secondary | ICD-10-CM

## 2021-01-06 DIAGNOSIS — R252 Cramp and spasm: Secondary | ICD-10-CM

## 2021-01-06 DIAGNOSIS — Z125 Encounter for screening for malignant neoplasm of prostate: Secondary | ICD-10-CM

## 2021-01-06 DIAGNOSIS — M25561 Pain in right knee: Secondary | ICD-10-CM

## 2021-01-06 DIAGNOSIS — M25562 Pain in left knee: Secondary | ICD-10-CM

## 2021-01-06 MED FILL — Diazepam Tab 5 MG: ORAL | 8 days supply | Qty: 8 | Fill #0 | Status: AC

## 2021-01-06 NOTE — Progress Notes (Addendum)
Subjective:    Patient ID: Todd Jones, male    DOB: 10-20-1960, 60 y.o.   MRN: [id number]  HPI   Pt in for follow up he has chronic severe knee pain.  Knew job [employer]. Walking a lot. Knee pain little worse.  Pt has hx of severe degenerative changes/osteoarthritis. Pt rt knee pain is the worse. He had 3 surgeries in the past/meniscus repair. Former did not get surgeries as he noted in past was recommend knee replacement. But financial issues prevented from getting surgery. Pt on oxycodone due to severe pain. Pt states norco was not adequate and upset his stomach. Pt up to date on contract and uds.  Last xray 05-2020.  IMPRESSION: Moderate degenerative joint disease. No acute abnormality seen in the right knee.   Pt planning on getting medicaid.    Pt has htn his bp is well controlled. On lisinopril and hctz.  Also on high cholesterol. On zocor.   Pt is vaccinated against covid.  Pt has insomnia. He used some of son Remus Loffler. He states that helped.  Also history of htn.   Have referred for colonoscopy. On review looks like never done.  I wanted to get uds today but pt declined stating he had used marijuana recently and did not want to do test since he new that it would come back positive.    Review of Systems  Constitutional: Negative for chills, fatigue and fever.  Respiratory: Negative for chest tightness, shortness of breath and wheezing.   Cardiovascular: Negative for chest pain and palpitations.  Gastrointestinal: Negative for abdominal pain, blood in stool, diarrhea and rectal pain.  Genitourinary: Negative for dysuria, frequency and hematuria.  Musculoskeletal: Negative for back pain and neck pain.       Knee pain. See hpi.   Recent severe leg cramps at night.   Skin: Negative for rash.  Neurological: Negative for dizziness and headaches.  Hematological: Negative for adenopathy. Does not bruise/bleed easily.  Psychiatric/Behavioral: Positive  for sleep disturbance. Negative for decreased concentration and dysphoric mood.       Hx of in past    Past Medical History:  Diagnosis Date  . Arthritis   . GERD (gastroesophageal reflux disease)   . H/O hiatal hernia   . Hypertension   . Insomnia      Social History   Socioeconomic History  . Marital status: Married    Spouse name: Not on file  . Number of children: Not on file  . Years of education: Not on file  . Highest education level: Not on file  Occupational History  . Not on file  Tobacco Use  . Smoking status: Former Games developer  . Smokeless tobacco: Never Used  Substance and Sexual Activity  . Alcohol use: No  . Drug use: Yes    Types: Marijuana  . Sexual activity: Not on file  Other Topics Concern  . Not on file  Social History Narrative  . Not on file   Social Determinants of Health   Financial Resource Strain: Not on file  Food Insecurity: Not on file  Transportation Needs: Not on file  Physical Activity: Not on file  Stress: Not on file  Social Connections: Not on file  Intimate Partner Violence: Not on file    Past Surgical History:  Procedure Laterality Date  . CHOLECYSTECTOMY    . INGUINAL HERNIA REPAIR  10/14/2012   Procedure: LAPAROSCOPIC INGUINAL HERNIA;  Surgeon: Axel Filler, MD;  Location: [facility];  Service: General;  Laterality: Right;  . INSERTION OF MESH  10/14/2012   Procedure: INSERTION OF MESH;  Surgeon: Axel Filler, MD;  Location: [facility];  Service: General;  Laterality: Right;  . KNEE ARTHROSCOPY     right x 3  . KNEE FUSION      Family History  Problem Relation Age of Onset  . Cancer Maternal Grandfather        lung  . Alcohol abuse Maternal Grandfather     Allergies  Allergen Reactions  . Hydrocodone     Stomach upset  . Toprol Xl [Metoprolol Tartrate]     confusion    Current Outpatient Medications on File Prior to Visit  Medication Sig Dispense Refill  . amLODipine (NORVASC) 10 MG tablet TAKE 1 TABLET (10  MG TOTAL) BY MOUTH DAILY. 90 tablet 3  . diazepam (VALIUM) 5 MG tablet TAKE 1 TABLET BY MOUTH AS NEEDED FOR ANXIETY OR INSOMNIA 8 tablet 3  . hydrochlorothiazide (MICROZIDE) 12.5 MG capsule TAKE 1 CAPSULE (12.5 MG TOTAL) BY MOUTH DAILY. 90 capsule 3  . hydrOXYzine (ATARAX/VISTARIL) 25 MG tablet TAKE ONE TABLET BY MOUTH NIGHTLY AT BEDTIME FOR INSOMNIA 30 tablet 0  . lisinopril (ZESTRIL) 40 MG tablet TAKE 1 TABLET (40 MG TOTAL) BY MOUTH DAILY. 90 tablet 3  . oxyCODONE-acetaminophen (PERCOCET) 7.5-325 MG tablet TAKE 1 TABLET BY MOUTH EVERY 12 HOURS AS NEEDED FOR PAIN 60 tablet 0  . simvastatin (ZOCOR) 20 MG tablet TAKE 1 TABLET (20 MG TOTAL) BY MOUTH AT BEDTIME. 90 tablet 1  . traZODone (DESYREL) 50 MG tablet Take 0.5-1 tablets (25-50 mg total) by mouth at bedtime as needed for sleep. (Patient not taking: No sig reported) 30 tablet 5   No current facility-administered medications on file prior to visit.    BP 132/82   Pulse 66   Resp 20   Ht 6\' 1"  (1.854 m)   Wt 198 lb (89.8 kg)   SpO2 99%   BMI 26.12 kg/m       Objective:   Physical Exam  General Mental Status- Alert. General Appearance- Not in acute distress.   Skin General: Color- Normal Color. Moisture- Normal Moisture.  Neck Carotid Arteries- Normal color. Moisture- Normal Moisture. No carotid bruits. No JVD.  Chest and Lung Exam Auscultation: Breath Sounds:-Normal.  Cardiovascular Auscultation:Rythm- Regular. Murmurs & Other Heart Sounds:Auscultation of the heart reveals- No Murmurs.  Abdomen Inspection:-Inspeection Normal. Palpation/Percussion:Note:No mass. Palpation and Percussion of the abdomen reveal- Non Tender, Non Distended + BS, no rebound or guarding.   Neurologic Cranial Nerve exam:- CN III-XII intact(No nystagmus), symmetric smile. Strength:- 5/5 equal and symmetric strength both upper and lower extremities.   Knees- rt knee swollen. Moderate to severe crepitus. Pain over tibial plateau Pain  rom worse today. Left knee- not as swollen. Also pain on rom.      Assessment & Plan:  Pt bp controlled today. Pt on amlodpine, lisinopril and hctz.  For high cholesterol pt on simvastatin.   For anxiety and insomnia, uses periodic valium.   For chronic knee pain on oxycodone.   However did decide today after discussion with one of my supervising MD Dr. that we would no longer prescribe controlled meds and refer to pain management. Will send pt my chart message and offer referral to sports med for possible steroid injections.   Labs declined today. Recommend getting labs when you have insurance.   For elevated sugar recommend low sugar diet.  Placed order screening colonoscopy.  Follow up 3-4 months or as needed  Explained to patient why need to repeat urine drug screen today.(pt expressed understanding).   Discussed management of pt pain with Dr. Abner Greenspan today.  Esperanza Richters, PA-C

## 2021-01-06 NOTE — Patient Instructions (Addendum)
Pt bp controlled today. Pt on amlodpine, lisinopril and hctz.  For high cholesterol pt on simvastatin.   For anxiety and insomnia, uses periodic valium.   For chronic knee pain on oxycodone.  However did decide today after discussion with one of my supervising MD Dr. Abner Greenspan that we would no longer prescribe controlled meds and refer to pain management. Will send pt my chart message and offer referral to sports med for possible steroid injections.   Labs declined today. Recommend getting labs when you have insurance.   For elevated sugar recommend low sugar diet.  Placed order screening colonoscopy.   For leg cramps. Offered cmp and magnesium but declined.  Follow up 3-4 months or as needed

## 2021-01-07 ENCOUNTER — Other Ambulatory Visit (HOSPITAL_BASED_OUTPATIENT_CLINIC_OR_DEPARTMENT_OTHER): Payer: Self-pay

## 2021-01-08 ENCOUNTER — Other Ambulatory Visit (HOSPITAL_BASED_OUTPATIENT_CLINIC_OR_DEPARTMENT_OTHER): Payer: Self-pay

## 2021-01-09 ENCOUNTER — Telehealth: Payer: Self-pay | Admitting: Medical

## 2021-01-09 ENCOUNTER — Ambulatory Visit: Payer: Self-pay | Admitting: Medical

## 2021-01-09 NOTE — Telephone Encounter (Signed)
Pt.notified

## 2021-01-09 NOTE — Telephone Encounter (Signed)
Medication: oxyCODONE-acetaminophen (PERCOCET) 7.5-325 MG tablet    Has the patient contacted their pharmacy? No. (If no, request that the patient contact the pharmacy for the refill.) (If yes, when and what did the pharmacy advise?)  Preferred Pharmacy (with phone number or street name): [facility]  [address], [address] [place] [address]  Phone:  [phone] Fax:  [phone]  DEA #:  [id number]  Agent: Please be advised that RX refills may take up to 3 business days. We ask that you follow-up with your pharmacy.

## 2021-01-09 NOTE — Telephone Encounter (Signed)
Let patient know that I can't prescribe him any controlled meds further.   He declined/refused  to take drug screen the other day admitting that he used marijuana recently. In addition he had positive drug screen in the past. I had explained on that positive drug screen 2 or more years ago  that  if future drug screen positive  for marijuana or other substance then can't continue to prescribe controlled meds  His admission to use/refuse to test same as + drug screen.   Also on review numerous  past drug screens did not show pain med?  Reviewed with supervisor Dr. Abner Greenspan and states can't give further.   I did place pain management referral.   Also I placed referral to sports medicine. Sports medicine might be able inject knees and give some pain relief.   He get back in with orthopedist as well as he mentioned orthopedist stated knee replacement eventually needed but he has declined in past since no insurance.

## 2021-01-09 NOTE — Telephone Encounter (Signed)
Requesting: Percocet Contract:09/18/20 UDS:12/21 Last Visit:01/06/21 Next Visit:n/a Last Refill:12/07/20  Please Advise

## 2021-02-27 ENCOUNTER — Other Ambulatory Visit (HOSPITAL_BASED_OUTPATIENT_CLINIC_OR_DEPARTMENT_OTHER): Payer: Self-pay

## 2021-02-27 MED FILL — Hydrochlorothiazide Cap 12.5 MG: ORAL | 90 days supply | Qty: 90 | Fill #0 | Status: CN

## 2021-02-27 MED FILL — Simvastatin Tab 20 MG: ORAL | 90 days supply | Qty: 90 | Fill #0 | Status: AC

## 2021-02-27 MED FILL — Lisinopril Tab 40 MG: ORAL | 90 days supply | Qty: 90 | Fill #0 | Status: AC

## 2021-02-27 MED FILL — Amlodipine Besylate Tab 10 MG (Base Equivalent): ORAL | 90 days supply | Qty: 90 | Fill #0 | Status: AC

## 2021-03-03 ENCOUNTER — Other Ambulatory Visit (HOSPITAL_BASED_OUTPATIENT_CLINIC_OR_DEPARTMENT_OTHER): Payer: Self-pay

## 2021-03-04 ENCOUNTER — Other Ambulatory Visit (HOSPITAL_BASED_OUTPATIENT_CLINIC_OR_DEPARTMENT_OTHER): Payer: Self-pay

## 2021-10-29 ENCOUNTER — Telehealth: Payer: Self-pay

## 2021-10-29 NOTE — Telephone Encounter (Signed)
Pt reports that he has moved and he will no longer come to our office. He is going to [place] closer to [place].  I have given him a verbal list of his medication and informed him that he can complete a medical release form to get records from our office to theirs depending on what they want. Pt stated understanding and get that form completed there.

## 2021-12-29 IMAGING — DX DG KNEE 3 VIEWS*R*
3 series · 3 of 3 positions shown · non-contrast
Comparison: None.

CLINICAL DATA: Chronic right knee pain.

EXAM:
RIGHT KNEE - 3 VIEW

[knee ap]
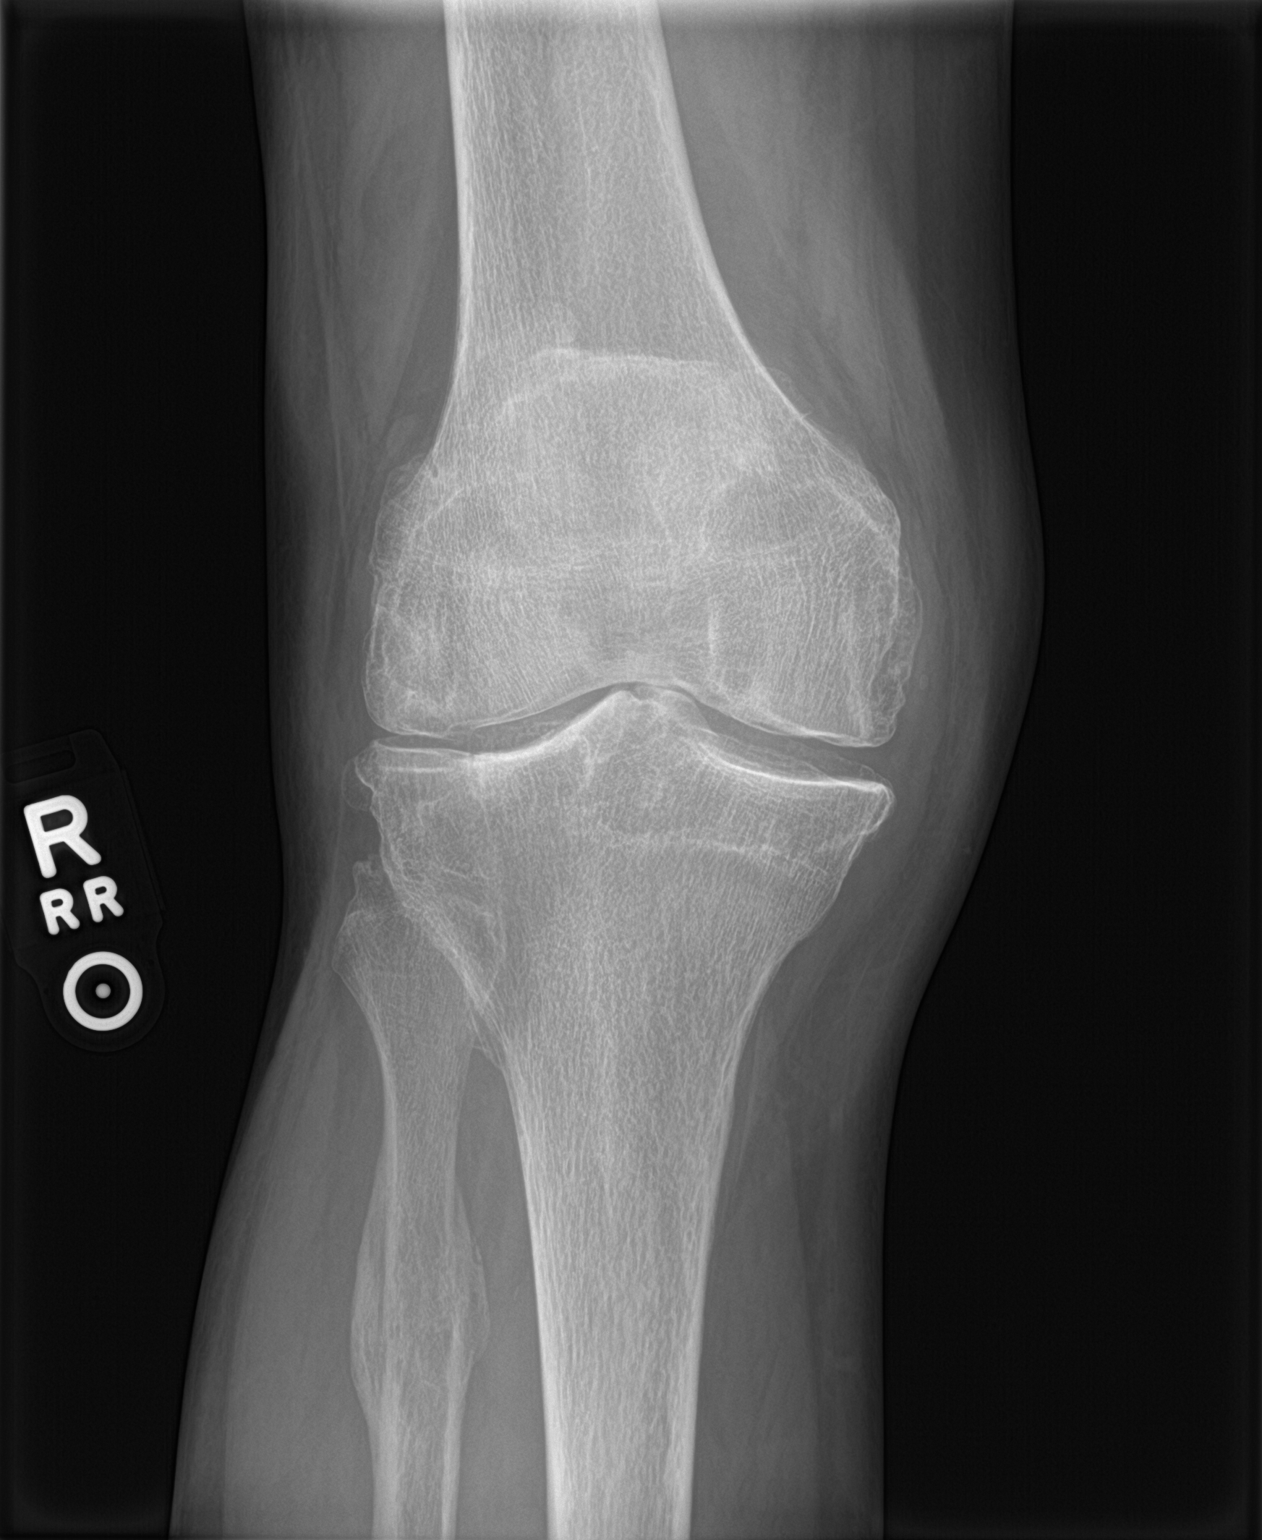

[knee lat]
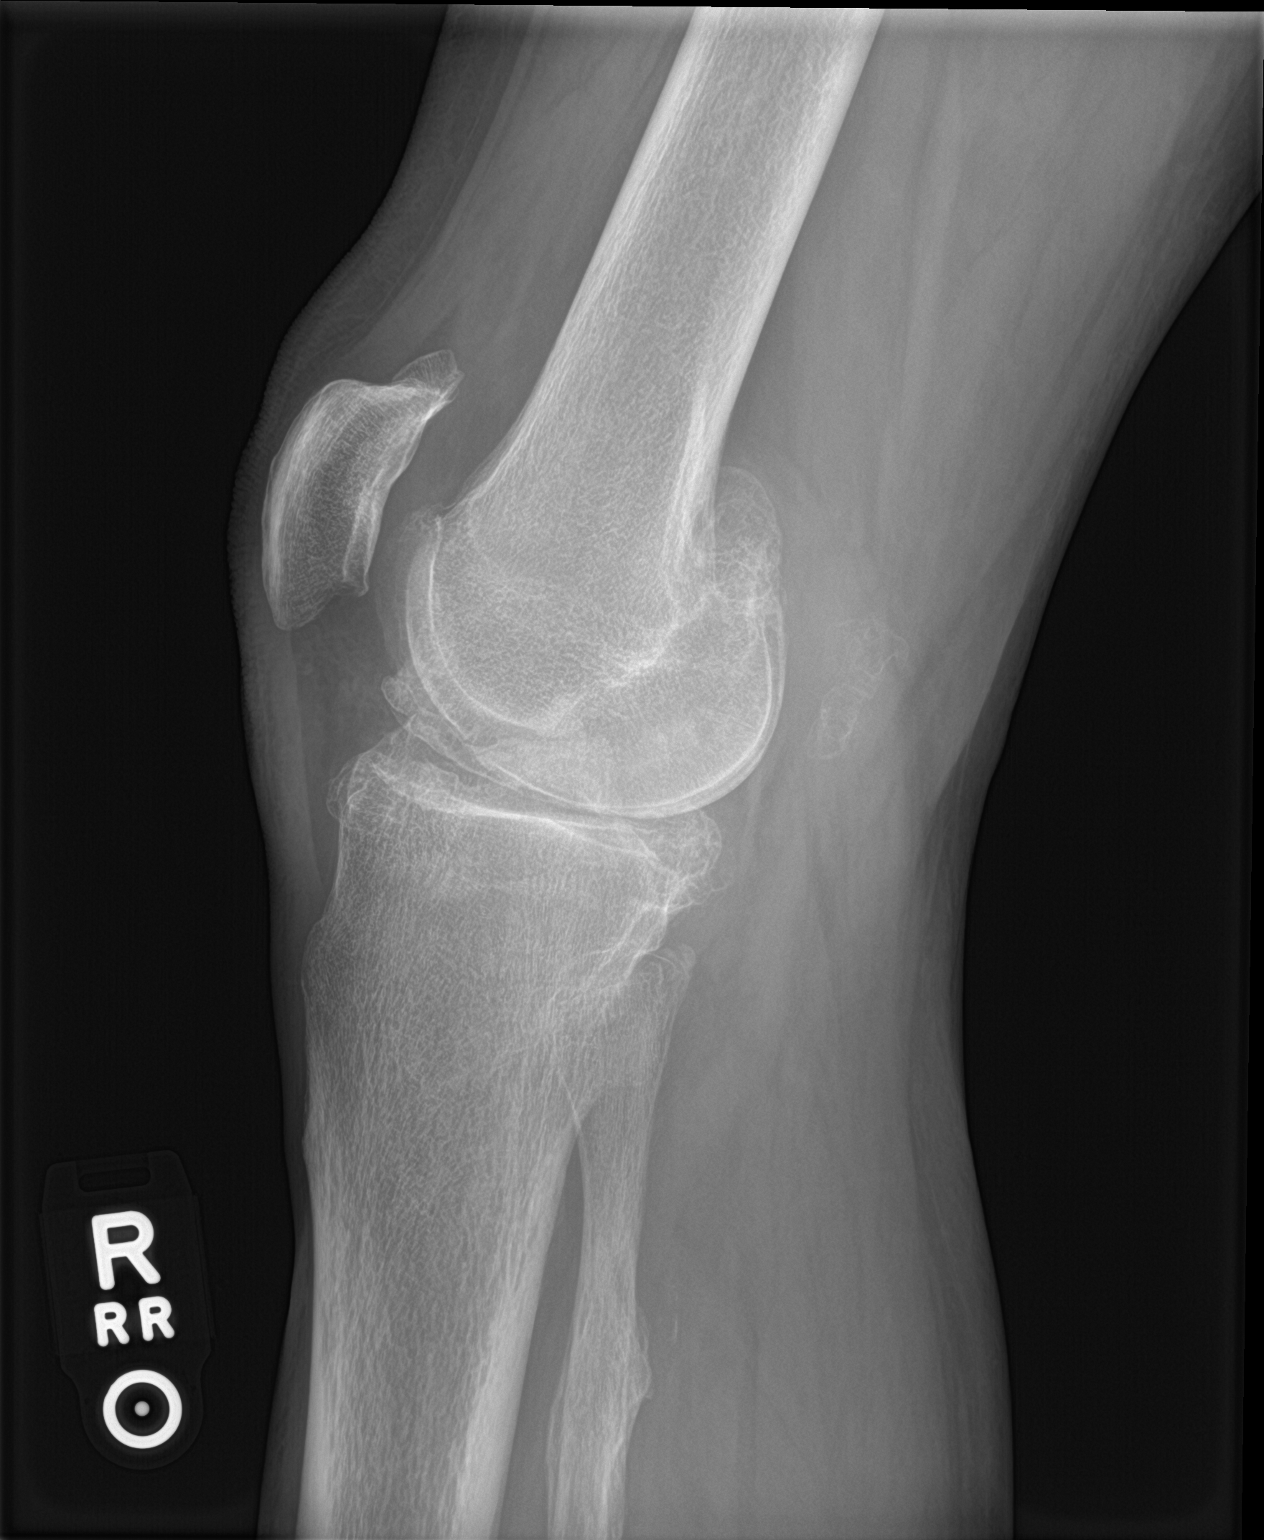

[knee sunrise]
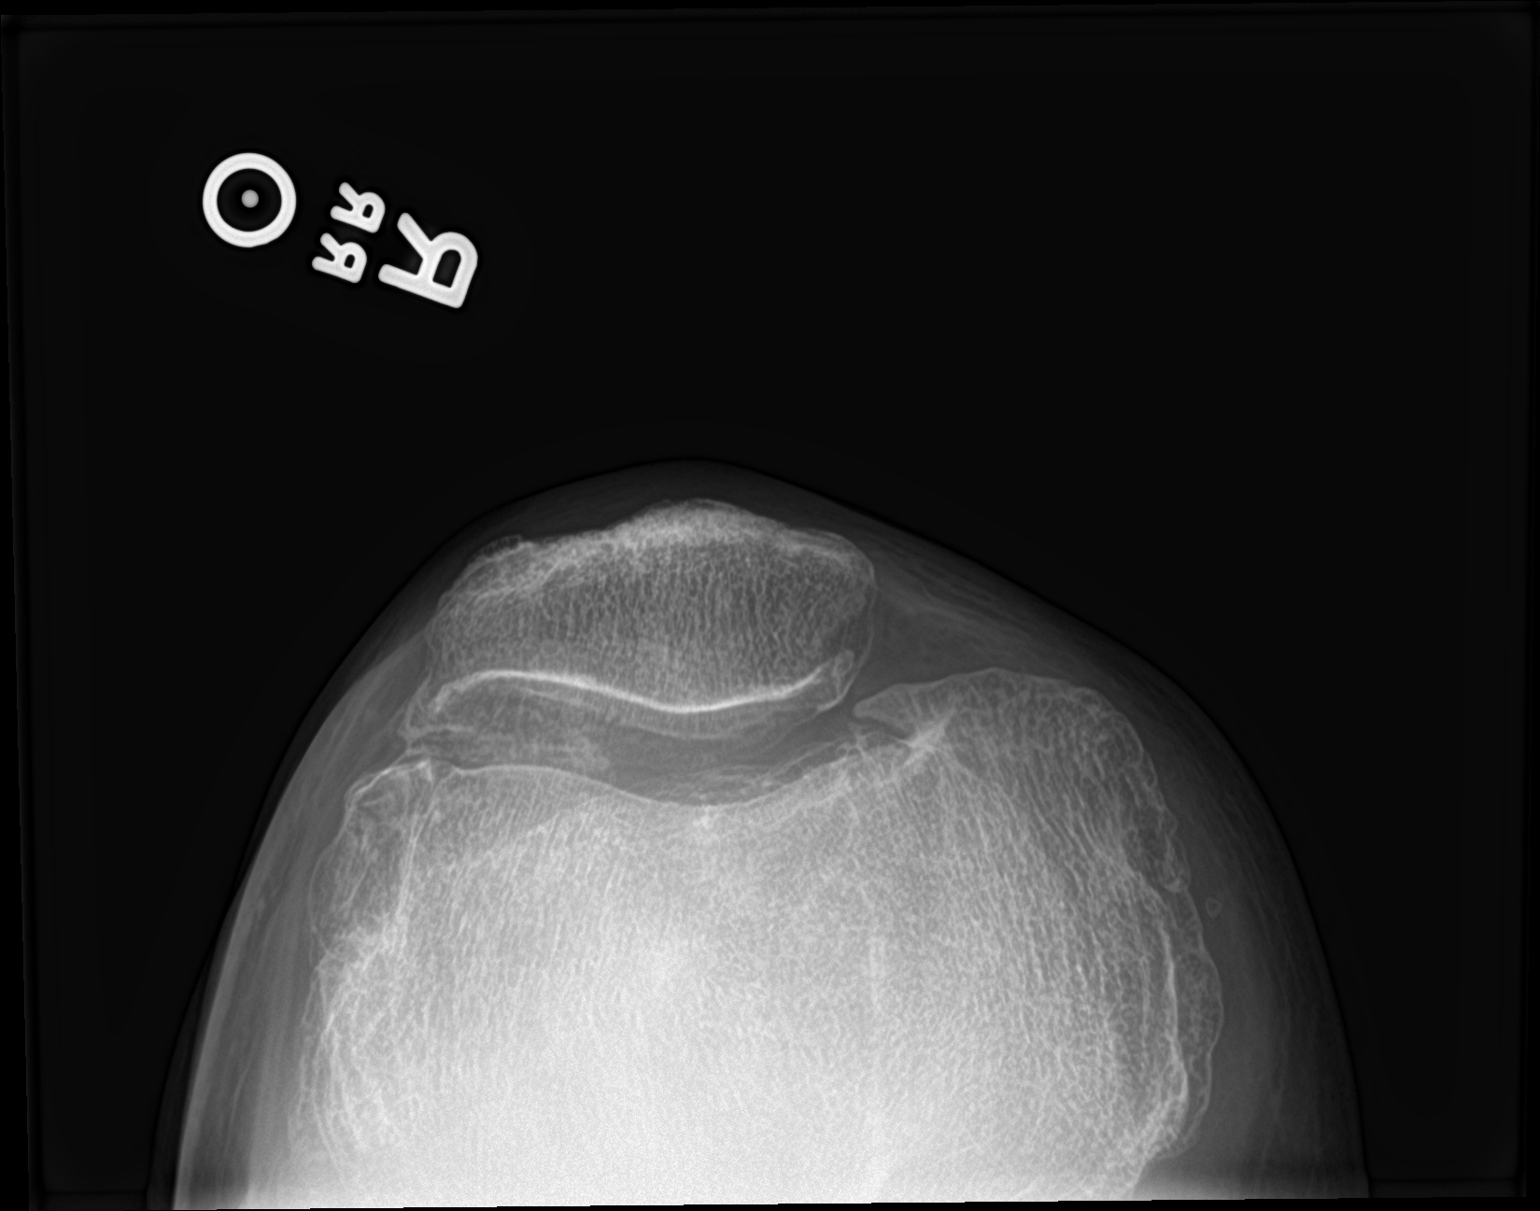

[3 of 3 positions shown; findings below may reference images not displayed]

FINDINGS: No evidence of fracture, dislocation, or joint effusion. Moderate
narrowing of lateral joint space is noted. Mild narrowing of medial
joint space is noted. Patellar spurring is noted. Soft tissues are
unremarkable.
IMPRESSION: Moderate degenerative joint disease. No acute abnormality seen in
the right knee.
